# Patient Record
Sex: Male | Born: 1970 | Race: Black or African American | Hispanic: No | Marital: Single | Smoking: Former smoker
Health system: Southern US, Community
[De-identification: ages and names within clinical notes are randomized; demographics above are authoritative.]

## PROBLEM LIST (undated history)

## (undated) DIAGNOSIS — F419 Anxiety disorder, unspecified: Secondary | ICD-10-CM

## (undated) DIAGNOSIS — I10 Essential (primary) hypertension: Secondary | ICD-10-CM

---

## 2011-04-16 ENCOUNTER — Emergency Department (HOSPITAL_COMMUNITY)
Admission: EM | Admit: 2011-04-16 | Discharge: 2011-04-16 | Disposition: A | Discharge status: Home / Self Care | Payer: Self-pay | Attending: Emergency Medicine | Admitting: Emergency Medicine

## 2011-04-16 DIAGNOSIS — R040 Epistaxis: Secondary | ICD-10-CM | POA: Insufficient documentation

## 2011-04-16 DIAGNOSIS — Z79899 Other long term (current) drug therapy: Secondary | ICD-10-CM | POA: Insufficient documentation

## 2011-04-16 DIAGNOSIS — I10 Essential (primary) hypertension: Secondary | ICD-10-CM | POA: Insufficient documentation

## 2011-10-18 ENCOUNTER — Emergency Department (HOSPITAL_COMMUNITY)
Admission: EM | Admit: 2011-10-18 | Discharge: 2011-10-18 | Discharge status: Against Medical Advice | Payer: PRIVATE HEALTH INSURANCE | Attending: Emergency Medicine | Admitting: Emergency Medicine

## 2011-10-18 DIAGNOSIS — I10 Essential (primary) hypertension: Secondary | ICD-10-CM | POA: Insufficient documentation

## 2011-10-18 DIAGNOSIS — Z79899 Other long term (current) drug therapy: Secondary | ICD-10-CM | POA: Insufficient documentation

## 2011-10-18 LAB — CBC
MCH: 32.4 pg (ref 26.0–34.0)
MCHC: 35.3 g/dL (ref 30.0–36.0)
MCV: 91.6 fL (ref 78.0–100.0)
Platelets: 185 10*3/uL (ref 150–400)
RBC: 4.76 MIL/uL (ref 4.22–5.81)
RDW: 15.1 % (ref 11.5–15.5)

## 2011-10-18 LAB — DIFFERENTIAL
Basophils Relative: 0 % (ref 0–1)
Eosinophils Relative: 2 % (ref 0–5)
Lymphs Abs: 5.8 10*3/uL — ABNORMAL HIGH (ref 0.7–4.0)
Monocytes Absolute: 0.2 10*3/uL (ref 0.1–1.0)
Neutro Abs: 5.8 10*3/uL (ref 1.7–7.7)

## 2011-10-18 LAB — POCT I-STAT, CHEM 8
BUN: 15 mg/dL (ref 6–23)
Calcium, Ion: 1.16 mmol/L (ref 1.12–1.32)
TCO2: 26 mmol/L (ref 0–100)

## 2011-10-18 LAB — PATHOLOGIST SMEAR REVIEW

## 2011-11-10 ENCOUNTER — Emergency Department (HOSPITAL_COMMUNITY)
Admission: EM | Admit: 2011-11-10 | Discharge: 2011-11-10 | Disposition: A | Discharge status: Home / Self Care | Payer: PRIVATE HEALTH INSURANCE | Attending: Emergency Medicine | Admitting: Emergency Medicine

## 2011-11-10 DIAGNOSIS — Z760 Encounter for issue of repeat prescription: Secondary | ICD-10-CM | POA: Insufficient documentation

## 2011-11-10 DIAGNOSIS — Z79899 Other long term (current) drug therapy: Secondary | ICD-10-CM | POA: Insufficient documentation

## 2011-11-10 DIAGNOSIS — I10 Essential (primary) hypertension: Secondary | ICD-10-CM | POA: Insufficient documentation

## 2011-11-10 DIAGNOSIS — Z7982 Long term (current) use of aspirin: Secondary | ICD-10-CM | POA: Insufficient documentation

## 2011-11-10 HISTORY — DX: Essential (primary) hypertension: I10

## 2011-11-10 MED ORDER — CLONIDINE HCL 0.3 MG PO TABS: 0.3000 mg | ORAL_TABLET | Freq: Three times a day (TID) | ORAL | Status: DC

## 2011-11-10 MED ORDER — LISINOPRIL 20 MG PO TABS: 20.0000 mg | ORAL_TABLET | Freq: Every day | ORAL | Status: DC

## 2011-11-10 MED ORDER — HYDROCHLOROTHIAZIDE 25 MG PO TABS: 25.0000 mg | ORAL_TABLET | Freq: Every day | ORAL | Status: DC

## 2011-11-10 NOTE — ED Provider Notes (Signed)
History     CSN: [id number] Arrival date & time: 11/10/2011 10:27 AM   First MD Initiated Contact with Patient 11/10/11 1111      Chief Complaint  Patient presents with  . Hypertension    ran out of his meds yesterday, and need his meds refilled.   Had his meds filled a few weeks ago and he was suppose t o get Clonidine 0.3 mg TID but it was writtent for BID.     (Consider location/radiation/quality/duration/timing/severity/associated sxs/prior treatment) Patient is a 40 y.o. male presenting with hypertension. The history is provided by the patient.  Hypertension   is reports his been out of his blood pressure medicines for several days.  He reports he is on clonidine lisinopril and hydrochlorothiazide.  Denies chest pain shortness of breath.  Denies headache or dizziness.  Denies unilateral weakness.  Denies syncope.  Nothing worsens the symptoms.  Nothing improves his symptoms.  His pain is 0/10.  Past Medical History  Diagnosis Date  . Hypertension     History reviewed. No pertinent past surgical history.  History reviewed. No pertinent family history.  History  Substance Use Topics  . Smoking status: Not on file  . Smokeless tobacco: Not on file  . Alcohol Use: Yes      Review of Systems  All other systems reviewed and are negative.    Allergies  Review of patient's allergies indicates no known allergies.  Home Medications   Current Outpatient Rx  Name Route Sig Dispense Refill  . ASPIRIN-ACETAMINOPHEN-CAFFEINE 250-250-65 MG PO TABS Oral Take 2-3 tablets by mouth daily as needed. For pain.     Marland Kitchen CLONIDINE HCL 0.3 MG PO TABS Oral Take 0.3 mg by mouth 3 (three) times daily.      Marland Kitchen LISINOPRIL-HYDROCHLOROTHIAZIDE 20-25 MG PO TABS Oral Take 1 tablet by mouth daily.        BP 199/125  Pulse 80  Temp(Src) 98.3 F (36.8 C) (Oral)  Resp 20  Ht 5\' 7"  (1.702 m)  Wt 185 lb (83.915 kg)  BMI 28.97 kg/m2  SpO2 99%  Physical Exam  Nursing note and vitals  reviewed. Constitutional: He is oriented to person, place, and time. He appears well-developed and well-nourished.  HENT:  Head: Normocephalic and atraumatic.  Eyes: EOM are normal.  Neck: Normal range of motion.  Cardiovascular: Normal rate, regular rhythm, normal heart sounds and intact distal pulses.   Pulmonary/Chest: Effort normal and breath sounds normal. No respiratory distress.  Abdominal: Soft. He exhibits no distension. There is no tenderness.  Musculoskeletal: Normal range of motion.  Neurological: He is alert and oriented to person, place, and time.  Skin: Skin is warm and dry.  Psychiatric: He has a normal mood and affect. Judgment normal.    ED Course  Procedures (including critical care time)  Labs Reviewed - No data to display No results found.   1. Hypertension   2. Medication refill       MDM  BP meds written for.  Patient has insurance and will be returning to [facility] tomorrow followup with his doctor.  The patient was given a 30 day supply of history blood pressure medicines.  His blood pressure on arrival 199/125 over the patient is asymptomatic without any symptoms consistent with endorgan failure.        Lyanne Co, MD 11/10/11 1140

## 2011-11-10 NOTE — ED Notes (Signed)
Pt. Needs his BP meds  Refilled. Denies any headaches, dizziness , sob .

## 2012-02-03 ENCOUNTER — Emergency Department: Payer: Self-pay | Admitting: Emergency Medicine

## 2012-02-03 LAB — COMPREHENSIVE METABOLIC PANEL
Alkaline Phosphatase: 59 U/L (ref 50–136)
BUN: 23 mg/dL — ABNORMAL HIGH (ref 7–18)
Bilirubin,Total: 0.5 mg/dL (ref 0.2–1.0)
Calcium, Total: 9.3 mg/dL (ref 8.5–10.1)
Chloride: 103 mmol/L (ref 98–107)
Co2: 28 mmol/L (ref 21–32)
EGFR (African American): >60
EGFR (Non-African Amer.): >60
Glucose: 98 mg/dL (ref 65–99)
Potassium: 3.5 mmol/L (ref 3.5–5.1)
SGOT(AST): 27 U/L (ref 15–37)
SGPT (ALT): 46 U/L
Sodium: 139 mmol/L (ref 136–145)

## 2012-02-03 LAB — CBC
HGB: 15.6 g/dL (ref 13.0–18.0)
MCH: 31.8 pg (ref 26.0–34.0)
MCHC: 33.3 g/dL (ref 32.0–36.0)
RDW: 14.4 % (ref 11.5–14.5)

## 2012-03-07 ENCOUNTER — Emergency Department (HOSPITAL_COMMUNITY)
Admission: EM | Admit: 2012-03-07 | Discharge: 2012-03-07 | Disposition: A | Discharge status: Home / Self Care | Payer: Self-pay | Source: Home / Self Care | Attending: Emergency Medicine | Admitting: Emergency Medicine

## 2012-03-07 ENCOUNTER — Encounter (HOSPITAL_COMMUNITY): Payer: Self-pay

## 2012-03-07 DIAGNOSIS — I10 Essential (primary) hypertension: Secondary | ICD-10-CM

## 2012-03-07 MED ORDER — CLONIDINE HCL 0.3 MG PO TABS: 0.3000 mg | ORAL_TABLET | Freq: Two times a day (BID) | ORAL | Status: DC

## 2012-03-07 MED ORDER — ATENOLOL 50 MG PO TABS: 25.0000 mg | ORAL_TABLET | Freq: Every day | ORAL | Status: DC

## 2012-03-07 MED ORDER — TRIAMTERENE-HCTZ 37.5-25 MG PO TABS: 1.0000 | ORAL_TABLET | Freq: Every day | ORAL | Status: DC

## 2012-03-07 NOTE — ED Provider Notes (Signed)
History     CSN: [id number]  Arrival date & time 03/07/12  1115   First MD Initiated Contact with Patient 03/07/12 1118      Chief Complaint  Patient presents with  . Medication Refill    (Consider location/radiation/quality/duration/timing/severity/associated sxs/prior treatment) HPI Comments: Patient presents today to urgent care requesting blood pressure medicines refill describes no particular symptoms but describes that his blood pressure is always elevated in the 160s over 100s at home with a home monitor. Says that he feels is very dependent: Been taking 3 tablets per day and when he forgets and goes hours without surgical headache feels like his blood pressure is elevated so when he goes and checks there is usually higher.  Patient move recently and has no primary care doctor in the air describes that he's been on this medicines for 2-3 years and his blood pressure has always been high even with her previous provider.    The history is provided by the patient.    Past Medical History  Diagnosis Date  . Hypertension     History reviewed. No pertinent past surgical history.  History reviewed. No pertinent family history.  History  Substance Use Topics  . Smoking status: Not on file  . Smokeless tobacco: Not on file  . Alcohol Use: Yes      Review of Systems  Constitutional: Negative for fever and activity change.  Respiratory: Negative for chest tightness and shortness of breath.   Cardiovascular: Negative for chest pain, palpitations and leg swelling.  Neurological: Negative for dizziness, facial asymmetry, weakness and numbness.    Allergies  Review of patient's allergies indicates no known allergies.  Home Medications   Current Outpatient Rx  Name Route Sig Dispense Refill  . ASPIRIN-ACETAMINOPHEN-CAFFEINE 250-250-65 MG PO TABS Oral Take 2-3 tablets by mouth daily as needed. For pain.     . ATENOLOL 50 MG PO TABS Oral Take 0.5 tablets (25 mg total) by  mouth daily. 30 tablet 0  . CLONIDINE HCL 0.3 MG PO TABS Oral Take 0.3 mg by mouth 3 (three) times daily.      Marland Kitchen CLONIDINE HCL 0.3 MG PO TABS Oral Take 1 tablet (0.3 mg total) by mouth 2 (two) times daily. 90 tablet 0  . HYDROCHLOROTHIAZIDE 25 MG PO TABS Oral Take 1 tablet (25 mg total) by mouth daily. 30 tablet 0  . LISINOPRIL-HYDROCHLOROTHIAZIDE 20-25 MG PO TABS Oral Take 1 tablet by mouth daily.      . TRIAMTERENE-HCTZ 37.5-25 MG PO TABS Oral Take 1 each (1 tablet total) by mouth daily. 30 tablet 0    BP 170/110  Pulse 61  Temp(Src) 97.7 F (36.5 C) (Oral)  Resp 16  SpO2 97%  Physical Exam  Nursing note and vitals reviewed. Constitutional: He is oriented to person, place, and time. He appears well-developed and well-nourished. He appears lethargic. No distress.  HENT:  Head: Normocephalic.  Eyes: Conjunctivae are normal. No scleral icterus.  Neck: Neck supple. No JVD present.  Cardiovascular: Normal rate and regular rhythm.   Pulmonary/Chest: Effort normal. No respiratory distress. He has no decreased breath sounds. He has no wheezes. He has no rales. He exhibits no tenderness.  Lymphadenopathy:    He has no cervical adenopathy.  Neurological: He is oriented to person, place, and time. He has normal strength. He appears lethargic. He displays no tremor. No cranial nerve deficit or sensory deficit. He exhibits normal muscle tone.  Skin: Skin is warm.    ED Course  Procedures (including critical care time)  Labs Reviewed - No data to display No results found.   1. Hypertension       MDM   Chronic hypertension, recurrent with poor compliance and adherence to treatment asymptomatic during today's visit requesting medicine refill       Jimmie Molly, MD 03/07/12 1318

## 2012-03-07 NOTE — ED Notes (Signed)
Pt needs refills on clonidine and lisinopril.

## 2012-03-07 NOTE — Discharge Instructions (Signed)
We discuss her current high blood pressure regimen and the imperative need for you to followup with primary care Dr.  Layla Maw with her clonidine and start taking hydrochlorothiazide, after 6-7 days decrease her clonidine to twice a day. 2-had atenolol at the beginning of the second week and continue with your other 2 medicines. Monitor blood pressure as discussed in document readings,  if any further questions or changes before you see your new primary care doctor return for recheck.  If any changes such as chest pains, shortness of breath, swelling of your headaches, changes in vision associated with weakness or numbness or tingling of any extremity or facial go immediately to the emergency department as this could be signs of high blood pressure complications    Arterial Hypertension Arterial hypertension (high blood pressure) is a condition of elevated pressure in your blood vessels. Hypertension over a long period of time is a risk factor for strokes, heart attacks, and heart failure. It is also the leading cause of kidney (renal) failure.  CAUSES   In Adults -- Over 90% of all hypertension has no known cause. This is called essential or primary hypertension. In the other 10% of people with hypertension, the increase in blood pressure is caused by another disorder. This is called secondary hypertension. Important causes of secondary hypertension are:   Heavy alcohol use.   Obstructive sleep apnea.   Hyperaldosterosim (Conn's syndrome).   Steroid use.   Chronic kidney failure.   Hyperparathyroidism.   Medications.   Renal artery stenosis.   Pheochromocytoma.   Cushing's disease.   Coarctation of the aorta.   Scleroderma renal crisis.   Licorice (in excessive amounts).   Drugs (cocaine, methamphetamine).  Your caregiver can explain any items above that apply to you.  In Children -- Secondary hypertension is more common and should always be considered.   Pregnancy  -- Few women of childbearing age have high blood pressure. However, up to 10% of them develop hypertension of pregnancy. Generally, this will not harm the woman. It may be a sign of 3 complications of pregnancy: preeclampsia, HELLP syndrome, and eclampsia. Follow up and control with medication is necessary.  SYMPTOMS   This condition normally does not produce any noticeable symptoms. It is usually found during a routine exam.   Malignant hypertension is a late problem of high blood pressure. It may have the following symptoms:   Headaches.   Blurred vision.   End-organ damage (this means your kidneys, heart, lungs, and other organs are being damaged).   Stressful situations can increase the blood pressure. If a person with normal blood pressure has their blood pressure go up while being seen by their caregiver, this is often termed "white coat hypertension." Its importance is not known. It may be related with eventually developing hypertension or complications of hypertension.   Hypertension is often confused with mental tension, stress, and anxiety.  DIAGNOSIS  The diagnosis is made by 3 separate blood pressure measurements. They are taken at least 1 week apart from each other. If there is organ damage from hypertension, the diagnosis may be made without repeat measurements. Hypertension is usually identified by having blood pressure readings:  Above 140/90 mmHg measured in both arms, at 3 separate times, over a couple weeks.   Over 130/80 mmHg should be considered a risk factor and may require treatment in patients with diabetes.  Blood pressure readings over 120/80 mmHg are called "pre-hypertension" even in non-diabetic patients. To get a true blood pressure  measurement, use the following guidelines. Be aware of the factors that can alter blood pressure readings.  Take measurements at least 1 hour after caffeine.   Take measurements 30 minutes after smoking and without any stress. This  is another reason to quit smoking - it raises your blood pressure.   Use a proper cuff size. Ask your caregiver if you are not sure about your cuff size.   Most home blood pressure cuffs are automatic. They will measure systolic and diastolic pressures. The systolic pressure is the pressure reading at the start of sounds. Diastolic pressure is the pressure at which the sounds disappear. If you are elderly, measure pressures in multiple postures. Try sitting, lying or standing.   Sit at rest for a minimum of 5 minutes before taking measurements.   You should not be on any medications like decongestants. These are found in many cold medications.   Record your blood pressure readings and review them with your caregiver.  If you have hypertension:  Your caregiver may do tests to be sure you do not have secondary hypertension (see "causes" above).   Your caregiver may also look for signs of metabolic syndrome. This is also called Syndrome X or Insulin Resistance Syndrome. You may have this syndrome if you have type 2 diabetes, abdominal obesity, and abnormal blood lipids in addition to hypertension.   Your caregiver will take your medical and family history and perform a physical exam.   Diagnostic tests may include blood tests (for glucose, cholesterol, potassium, and kidney function), a urinalysis, or an EKG. Other tests may also be necessary depending on your condition.  PREVENTION  There are important lifestyle issues that you can adopt to reduce your chance of developing hypertension:  Maintain a normal weight.   Limit the amount of salt (sodium) in your diet.   Exercise often.   Limit alcohol intake.   Get enough potassium in your diet. Discuss specific advice with your caregiver.   Follow a DASH diet (dietary approaches to stop hypertension). This diet is rich in fruits, vegetables, and low-fat dairy products, and avoids certain fats.  PROGNOSIS  Essential hypertension cannot be  cured. Lifestyle changes and medical treatment can lower blood pressure and reduce complications. The prognosis of secondary hypertension depends on the underlying cause. Many people whose hypertension is controlled with medicine or lifestyle changes can live a normal, healthy life.  RISKS AND COMPLICATIONS  While high blood pressure alone is not an illness, it often requires treatment due to its short- and long-term effects on many organs. Hypertension increases your risk for:  CVAs or strokes (cerebrovascular accident).   Heart failure due to chronically high blood pressure (hypertensive cardiomyopathy).   Heart attack (myocardial infarction).   Damage to the retina (hypertensive retinopathy).   Kidney failure (hypertensive nephropathy).  Your caregiver can explain list items above that apply to you. Treatment of hypertension can significantly reduce the risk of complications. TREATMENT   For overweight patients, weight loss and regular exercise are recommended. Physical fitness lowers blood pressure.   Mild hypertension is usually treated with diet and exercise. A diet rich in fruits and vegetables, fat-free dairy products, and foods low in fat and salt (sodium) can help lower blood pressure. Decreasing salt intake decreases blood pressure in a 1/3 of people.   Stop smoking if you are a smoker.  The steps above are highly effective in reducing blood pressure. While these actions are easy to suggest, they are difficult to achieve. Most  patients with moderate or severe hypertension end up requiring medications to bring their blood pressure down to a normal level. There are several classes of medications for treatment. Blood pressure pills (antihypertensives) will lower blood pressure by their different actions. Lowering the blood pressure by 10 mmHg may decrease the risk of complications by as much as 25%. The goal of treatment is effective blood pressure control. This will reduce your risk  for complications. Your caregiver will help you determine the best treatment for you according to your lifestyle. What is excellent treatment for one person, may not be for you. HOME CARE INSTRUCTIONS   Do not smoke.   Follow the lifestyle changes outlined in the "Prevention" section.   If you are on medications, follow the directions carefully. Blood pressure medications must be taken as prescribed. Skipping doses reduces their benefit. It also puts you at risk for problems.   Follow up with your caregiver, as directed.   If you are asked to monitor your blood pressure at home, follow the guidelines in the "Diagnosis" section above.  SEEK MEDICAL CARE IF:   You think you are having medication side effects.   You have recurrent headaches or lightheadedness.   You have swelling in your ankles.   You have trouble with your vision.  SEEK IMMEDIATE MEDICAL CARE IF:   You have sudden onset of chest pain or pressure, difficulty breathing, or other symptoms of a heart attack.   You have a severe headache.   You have symptoms of a stroke (such as sudden weakness, difficulty speaking, difficulty walking).  MAKE SURE YOU:   Understand these instructions.   Will watch your condition.   Will get help right away if you are not doing well or get worse.  Document Released: 12/09/2005 Document Revised: 11/28/2011 Document Reviewed: 07/09/2007 [facility] Patient Information 2012 [place], [place].

## 2012-03-15 ENCOUNTER — Encounter (HOSPITAL_COMMUNITY): Payer: Self-pay | Admitting: Family Medicine

## 2012-03-15 ENCOUNTER — Other Ambulatory Visit: Payer: Self-pay

## 2012-03-15 ENCOUNTER — Inpatient Hospital Stay (HOSPITAL_COMMUNITY)
Admission: EM | Admit: 2012-03-15 | Discharge: 2012-03-17 | DRG: 305 | Disposition: A | Discharge status: Home / Self Care | Payer: Self-pay | Attending: Family Medicine | Admitting: Family Medicine

## 2012-03-15 ENCOUNTER — Inpatient Hospital Stay (HOSPITAL_COMMUNITY): Payer: Self-pay

## 2012-03-15 ENCOUNTER — Emergency Department (HOSPITAL_COMMUNITY): Payer: Self-pay

## 2012-03-15 DIAGNOSIS — I10 Essential (primary) hypertension: Secondary | ICD-10-CM

## 2012-03-15 DIAGNOSIS — R9431 Abnormal electrocardiogram [ECG] [EKG]: Secondary | ICD-10-CM

## 2012-03-15 DIAGNOSIS — R404 Transient alteration of awareness: Secondary | ICD-10-CM | POA: Diagnosis present

## 2012-03-15 DIAGNOSIS — I119 Hypertensive heart disease without heart failure: Secondary | ICD-10-CM | POA: Diagnosis present

## 2012-03-15 DIAGNOSIS — K5900 Constipation, unspecified: Secondary | ICD-10-CM | POA: Diagnosis present

## 2012-03-15 DIAGNOSIS — K5909 Other constipation: Secondary | ICD-10-CM | POA: Diagnosis present

## 2012-03-15 DIAGNOSIS — I498 Other specified cardiac arrhythmias: Secondary | ICD-10-CM | POA: Diagnosis present

## 2012-03-15 DIAGNOSIS — I1310 Hypertensive heart and chronic kidney disease without heart failure, with stage 1 through stage 4 chronic kidney disease, or unspecified chronic kidney disease: Principal | ICD-10-CM | POA: Diagnosis present

## 2012-03-15 DIAGNOSIS — F17200 Nicotine dependence, unspecified, uncomplicated: Secondary | ICD-10-CM | POA: Diagnosis present

## 2012-03-15 DIAGNOSIS — F411 Generalized anxiety disorder: Secondary | ICD-10-CM | POA: Diagnosis present

## 2012-03-15 DIAGNOSIS — E739 Lactose intolerance, unspecified: Secondary | ICD-10-CM | POA: Diagnosis present

## 2012-03-15 DIAGNOSIS — F1210 Cannabis abuse, uncomplicated: Secondary | ICD-10-CM | POA: Diagnosis present

## 2012-03-15 DIAGNOSIS — R55 Syncope and collapse: Secondary | ICD-10-CM

## 2012-03-15 DIAGNOSIS — N182 Chronic kidney disease, stage 2 (mild): Secondary | ICD-10-CM | POA: Diagnosis present

## 2012-03-15 LAB — DIFFERENTIAL
Basophils Absolute: 0 10*3/uL (ref 0.0–0.1)
Basophils Relative: 0 % (ref 0–1)
Eosinophils Absolute: 0.1 10*3/uL (ref 0.0–0.7)
Eosinophils Relative: 1 % (ref 0–5)
Lymphocytes Relative: 32 % (ref 12–46)
Lymphs Abs: 4.4 10*3/uL — ABNORMAL HIGH (ref 0.7–4.0)
Monocytes Absolute: 1.1 10*3/uL — ABNORMAL HIGH (ref 0.1–1.0)
Monocytes Relative: 8 % (ref 3–12)
Neutro Abs: 8.1 10*3/uL — ABNORMAL HIGH (ref 1.7–7.7)
Neutrophils Relative %: 59 % (ref 43–77)

## 2012-03-15 LAB — BASIC METABOLIC PANEL
BUN: 23 mg/dL (ref 6–23)
Calcium: 10.2 mg/dL (ref 8.4–10.5)
Creatinine, Ser: 1.47 mg/dL — ABNORMAL HIGH (ref 0.50–1.35)
GFR calc Af Amer: 68 mL/min — ABNORMAL LOW (ref >90)
GFR calc non Af Amer: 58 mL/min — ABNORMAL LOW (ref >90)
Potassium: 3.7 mEq/L (ref 3.5–5.1)

## 2012-03-15 LAB — URINALYSIS, ROUTINE W REFLEX MICROSCOPIC
Bilirubin Urine: NEGATIVE
Glucose, UA: NEGATIVE mg/dL
Hgb urine dipstick: NEGATIVE
Ketones, ur: NEGATIVE mg/dL
Leukocytes, UA: NEGATIVE
Nitrite: NEGATIVE
Protein, ur: NEGATIVE mg/dL
Specific Gravity, Urine: 1.021 (ref 1.005–1.030)
Urobilinogen, UA: 1 mg/dL (ref 0.0–1.0)
pH: 6 (ref 5.0–8.0)

## 2012-03-15 LAB — CARDIAC PANEL(CRET KIN+CKTOT+MB+TROPI)
Relative Index: 0.7 (ref 0.0–2.5)
Total CK: 499 U/L — ABNORMAL HIGH (ref 7–232)
Total CK: 515 U/L — ABNORMAL HIGH (ref 7–232)
Troponin I: <0.3 ng/mL (ref <0.30)
Troponin I: <0.3 ng/mL (ref <0.30)

## 2012-03-15 LAB — CBC
HCT: 47.2 % (ref 39.0–52.0)
MCHC: 35.2 g/dL (ref 30.0–36.0)
RDW: 15 % (ref 11.5–15.5)

## 2012-03-15 LAB — RAPID URINE DRUG SCREEN, HOSP PERFORMED
Barbiturates: NOT DETECTED
Tetrahydrocannabinol: POSITIVE — AB

## 2012-03-15 LAB — PRO B NATRIURETIC PEPTIDE: Pro B Natriuretic peptide (BNP): 59.5 pg/mL (ref 0–125)

## 2012-03-15 LAB — TROPONIN I: Troponin I: <0.3 ng/mL (ref <0.30)

## 2012-03-15 MED ORDER — ACETAMINOPHEN 650 MG RE SUPP: 650.0000 mg | Freq: Four times a day (QID) | RECTAL | Status: DC | PRN

## 2012-03-15 MED ORDER — ACETAMINOPHEN 325 MG PO TABS
650.0000 mg | ORAL_TABLET | Freq: Four times a day (QID) | ORAL | Status: DC | PRN
  Administered 2012-03-16 (×2): 650 mg via ORAL
  Filled 2012-03-15 (×2): qty 2

## 2012-03-15 MED ORDER — LISINOPRIL 10 MG PO TABS
10.0000 mg | ORAL_TABLET | Freq: Every day | ORAL | Status: DC
  Administered 2012-03-15: 10 mg via ORAL
  Filled 2012-03-15: qty 1

## 2012-03-15 MED ORDER — CLONIDINE HCL 0.3 MG PO TABS
0.3000 mg | ORAL_TABLET | Freq: Two times a day (BID) | ORAL | Status: DC
  Administered 2012-03-15: 0.3 mg via ORAL
  Filled 2012-03-15 (×4): qty 1

## 2012-03-15 MED ORDER — ASPIRIN EC 81 MG PO TBEC
81.0000 mg | DELAYED_RELEASE_TABLET | Freq: Every day | ORAL | Status: DC
  Administered 2012-03-15 – 2012-03-17 (×3): 81 mg via ORAL
  Filled 2012-03-15 (×4): qty 1

## 2012-03-15 MED ORDER — HYDRALAZINE HCL 25 MG PO TABS
25.0000 mg | ORAL_TABLET | Freq: Four times a day (QID) | ORAL | Status: DC
  Administered 2012-03-15 – 2012-03-17 (×8): 25 mg via ORAL
  Filled 2012-03-15 (×14): qty 1

## 2012-03-15 MED ORDER — SODIUM CHLORIDE 0.9 % IV SOLN
INTRAVENOUS | Status: AC
  Administered 2012-03-15: 100 mL/h via INTRAVENOUS

## 2012-03-15 MED ORDER — TRAZODONE 25 MG HALF TABLET
25.0000 mg | ORAL_TABLET | Freq: Every evening | ORAL | Status: DC | PRN
  Administered 2012-03-15: 25 mg via ORAL
  Filled 2012-03-15: qty 1

## 2012-03-15 MED ORDER — NITROGLYCERIN 0.3 MG SL SUBL
0.3000 mg | SUBLINGUAL_TABLET | SUBLINGUAL | Status: DC | PRN
  Filled 2012-03-15: qty 100

## 2012-03-15 MED ORDER — LISINOPRIL 20 MG PO TABS
20.0000 mg | ORAL_TABLET | Freq: Every day | ORAL | Status: DC
  Administered 2012-03-16 – 2012-03-17 (×2): 20 mg via ORAL
  Filled 2012-03-15 (×3): qty 1

## 2012-03-15 MED ORDER — NITROGLYCERIN 0.4 MG/SPRAY TL SOLN
1.0000 | Status: DC | PRN
  Filled 2012-03-15: qty 4.9

## 2012-03-15 MED ORDER — HYDROCHLOROTHIAZIDE 12.5 MG PO CAPS
12.5000 mg | ORAL_CAPSULE | Freq: Every day | ORAL | Status: DC
  Administered 2012-03-15: 12.5 mg via ORAL
  Filled 2012-03-15 (×2): qty 1

## 2012-03-15 MED ORDER — SODIUM CHLORIDE 0.9 % IV BOLUS (SEPSIS)
1000.0000 mL | Freq: Once | INTRAVENOUS | Status: AC
  Administered 2012-03-15: 1000 mL via INTRAVENOUS

## 2012-03-15 MED ORDER — ONDANSETRON HCL 4 MG/2ML IJ SOLN: 4.0000 mg | Freq: Three times a day (TID) | INTRAMUSCULAR | Status: AC | PRN

## 2012-03-15 MED ORDER — AMLODIPINE BESYLATE 10 MG PO TABS
10.0000 mg | ORAL_TABLET | Freq: Every day | ORAL | Status: DC
  Administered 2012-03-15 – 2012-03-17 (×3): 10 mg via ORAL
  Filled 2012-03-15 (×4): qty 1

## 2012-03-15 MED ORDER — DOCUSATE SODIUM 100 MG PO CAPS
100.0000 mg | ORAL_CAPSULE | Freq: Two times a day (BID) | ORAL | Status: DC
  Administered 2012-03-15 – 2012-03-17 (×3): 100 mg via ORAL
  Filled 2012-03-15 (×8): qty 1

## 2012-03-15 MED ORDER — ENOXAPARIN SODIUM 40 MG/0.4ML ~~LOC~~ SOLN
40.0000 mg | SUBCUTANEOUS | Status: DC
  Administered 2012-03-15 – 2012-03-16 (×2): 40 mg via SUBCUTANEOUS
  Filled 2012-03-15 (×4): qty 0.4

## 2012-03-15 MED ORDER — LISINOPRIL 10 MG PO TABS
10.0000 mg | ORAL_TABLET | Freq: Once | ORAL | Status: AC
  Administered 2012-03-15: 10 mg via ORAL
  Filled 2012-03-15: qty 1

## 2012-03-15 MED ORDER — HYDRALAZINE HCL 10 MG PO TABS
10.0000 mg | ORAL_TABLET | Freq: Four times a day (QID) | ORAL | Status: DC
  Administered 2012-03-15: 10 mg via ORAL
  Filled 2012-03-15 (×7): qty 1

## 2012-03-15 MED ORDER — INFLUENZA VIRUS VACC SPLIT PF IM SUSP
0.5000 mL | INTRAMUSCULAR | Status: AC
  Filled 2012-03-15: qty 0.5

## 2012-03-15 MED ORDER — SODIUM CHLORIDE 0.9 % IJ SOLN
3.0000 mL | Freq: Two times a day (BID) | INTRAMUSCULAR | Status: DC
Start: 2012-03-15 — End: 2012-03-17
  Administered 2012-03-15 – 2012-03-17 (×4): 3 mL via INTRAVENOUS

## 2012-03-15 NOTE — Progress Notes (Signed)
HS BP=187/111 HR=66--At 22:59pm texted DR Mikeal Hawthorne, received a phone call from Dr Mikeal Hawthorne @ 23:04pm orders received to go ahead & giv the Apresoline now as opposed to waiting til 12mn when due as scheduled med. The pt denies any pain ;sob; or c/o headache at this time. Also will give the prn for sleep also to see if this helps with BP problem to relax pt. Will con't to monitor for any problems.

## 2012-03-15 NOTE — ED Notes (Signed)
[id number]<AV> Expected date:03/15/12<BR> Expected time: 2:46 AM<BR> Means of arrival:Ambulance<BR> Comments:<BR> Syncopal episode

## 2012-03-15 NOTE — ED Notes (Signed)
Bed:WA01<BR> Expected date:<BR> Expected time:<BR> Means of arrival:<BR> Comments:<BR> closed

## 2012-03-15 NOTE — Progress Notes (Signed)
VASCULAR LAB PRELIMINARY  PRELIMINARY  PRELIMINARY  PRELIMINARY  Carotid Dopplers completed.    Preliminary report: No ICA stenosis.  Vertebral artery flow is antegrade.  Cory Rivera [place], 03/15/2012, 10:56 AM

## 2012-03-15 NOTE — Progress Notes (Signed)
BP 186/123 HR 62 patient resting comfortably in bed, no c/o pain or discomfort. Dr Laural Benes updated, new orders received. Will continue to monitor closely. Marisa Sprinkles RN

## 2012-03-15 NOTE — Progress Notes (Signed)
BP now 210/150 HR 68. 10 mg lisinopril and 10mg  hydralazine given at 1505. Dr Laural Benes updated and continuing to monitor closely. Marisa Sprinkles RN

## 2012-03-15 NOTE — ED Notes (Signed)
Per EMS: pt from home, per girlfriend pt had a syncopal episode, was sitting in the chair and states was "falling a sleep", pt girlfriend aroused him and "spring back to life", reports feeling hot, thereafter experienced weakness. Pt on the x4 BP medications.

## 2012-03-15 NOTE — ED Notes (Signed)
Pt. Alert and oriented x 3 . He states he took medications: Lisinopril 20mg , Clonidine0.3mg , Atenolol 50mg , and Triam/HCTZ 37.5/25mg  as scheduled tonight. He admits to taking an extra dose of clonidine b/c he thought his BP was elevated since he was having had an episode. Mew medication was received today: Atenolol 50mg . Pt. Does not recall events that happened after the syncope.

## 2012-03-15 NOTE — ED Notes (Signed)
Bed:WA21<BR> Expected date:<BR> Expected time:<BR> Means of arrival:<BR> Comments:<BR> hold

## 2012-03-15 NOTE — H&P (Signed)
History and Physical Examination  Date: 03/15/2012  Patient name: Cory Rivera Medical record number: [id number] Date of birth: [birth year] Age: 41 y.o. Gender: male PCP: No primary provider on file.  Attending physician: Glynn Octave, MD  Chief Complaint:  Chief Complaint  Patient presents with  . Loss of Consciousness  . Hypertension     History of Present Illness: Cory Rivera is an 41 y.o. male with a long history of poorly controlled hypertension presented to the emergency department after having a witnessed syncopal episode at home.  He was apparently sitting in a chair when he reported that he was feeling lightheaded and his eyes rolled in the back of his head and he passed out and fell on the floor.  He had recently been started on 4 different blood pressure medications.  He had not been seeing a primary care physician because he doesn't have medical insurance.  He reports that he's been feeling weaker since he started taking the blood pressure medications.  He reports that he uses marijuana on a daily basis.  The patient reports that he's having chronic constipation and abdominal pain on occasion.  He is lactose intolerant.  He denies chest pain or shortness of breath.  When he was seen in the emergency department he was found to have some EKG changes consistent with cardiomegaly and some flipped T waves were noted.  His first set of cardiac enzymes have been negative.  A hospital admission was requested for further evaluation and management.  Past Medical History Past Medical History  Diagnosis Date  . Hypertension     Past Surgical History No past surgical history on file.  Home Meds: Prior to Admission medications   Medication Sig Start Date End Date Taking? Authorizing Provider  aspirin-acetaminophen-caffeine (EXCEDRIN MIGRAINE) [phone] MG per tablet Take 2-3 tablets by mouth daily as needed. For pain.    Yes Historical Provider, MD  atenolol (TENORMIN) 50 MG  tablet Take 0.5 tablets (25 mg total) by mouth daily. 03/07/12 03/07/13 Yes Jimmie Molly, MD  cloNIDine (CATAPRES) 0.3 MG tablet Take 1 tablet (0.3 mg total) by mouth 2 (two) times daily. 03/07/12 03/07/13 Yes Jimmie Molly, MD  lisinopril (PRINIVIL,ZESTRIL) 20 MG tablet Take 20 mg by mouth daily.   Yes Historical Provider, MD  triamterene-hydrochlorothiazide (MAXZIDE-25) 37.5-25 MG per tablet Take 1 each (1 tablet total) by mouth daily. 03/07/12 04/04/13 Yes Jimmie Molly, MD    Allergies: Review of patient's allergies indicates no known allergies.  Social History:  History   Social History  . Marital Status: Single    Spouse Name: N/A    Number of Children: N/A  . Years of Education: N/A   Occupational History  . Not on file.   Social History Main Topics  . Smoking status: Not on file  . Smokeless tobacco: Not on file  . Alcohol Use: Yes  . Drug Use:   . Sexually Active:    Other Topics Concern  . Not on file   Social History Narrative  . No narrative on file   Family History: No family history on file.  Review of Systems: Pertinent items are noted in HPI. All other systems reviewed and reported as negative.   Physical Exam: Blood pressure 148/68, pulse 66, temperature 97.9 F (36.6 C), temperature source Oral, resp. rate 21, height 5\' 7"  (1.702 m), weight 86.183 kg (190 lb), SpO2 100.00%. BP 148/68  Pulse 66  Temp(Src) 97.9 F (36.6 C) (Oral)  Resp 21  Ht 5\' 7"  (  1.702 m)  Wt 86.183 kg (190 lb)  BMI 29.76 kg/m2  SpO2 100%  General Appearance:    Alert, cooperative, no distress, appears stated age  Head:    Normocephalic, without obvious abnormality, atraumatic  Eyes:    PERRL, conjunctiva/corneas clear, EOM's intact          Nose:   Nares normal, septum midline, mucosa normal, no drainage    or sinus tenderness  Throat:   Lips, mucosa, and tongue normal; teeth and gums normal  Neck:   Supple, symmetrical, trachea midline, no adenopathy;       thyroid:  No  enlargement/tenderness/nodules; no carotid   bruit or JVD     Lungs:     Clear to auscultation bilaterally, respirations unlabored  Chest wall:    No tenderness or deformity  Heart:    Regular rate and rhythm, S1 and S2 normal, no murmur, rub   or gallop  Abdomen:     Soft, non-tender, bowel sounds active all four quadrants,    no masses, no organomegaly        Extremities:   Extremities normal, atraumatic, no cyanosis or edema  Pulses:   2+ and symmetric all extremities  Skin:   Skin color, texture, turgor normal, no rashes or lesions  Lymph nodes:   Cervical, supraclavicular, and axillary nodes normal  Neurologic:   CNII-XII intact. Normal strength, sensation and reflexes      throughout    Lab  And Imaging results:  Results for orders placed during the hospital encounter of 03/15/12 (from the past 24 hour(s))  CBC     Status: Abnormal   Collection Time   03/15/12  3:34 AM      Component Value Range   WBC 13.7 (*) 4.0 - 10.5 (K/uL)   RBC 5.06  4.22 - 5.81 (MIL/uL)   Hemoglobin 16.6  13.0 - 17.0 (g/dL)   HCT 16.1  09.6 - 04.5 (%)   MCV 93.3  78.0 - 100.0 (fL)   MCH 32.8  26.0 - 34.0 (pg)   MCHC 35.2  30.0 - 36.0 (g/dL)   RDW 40.9  81.1 - 91.4 (%)   Platelets 190  150 - 400 (K/uL)  DIFFERENTIAL     Status: Abnormal   Collection Time   03/15/12  3:34 AM      Component Value Range   Neutrophils Relative 59  43 - 77 (%)   Lymphocytes Relative 32  12 - 46 (%)   Monocytes Relative 8  3 - 12 (%)   Eosinophils Relative 1  0 - 5 (%)   Basophils Relative 0  0 - 1 (%)   Neutro Abs 8.1 (*) 1.7 - 7.7 (K/uL)   Lymphs Abs 4.4 (*) 0.7 - 4.0 (K/uL)   Monocytes Absolute 1.1 (*) 0.1 - 1.0 (K/uL)   Eosinophils Absolute 0.1  0.0 - 0.7 (K/uL)   Basophils Absolute 0.0  0.0 - 0.1 (K/uL)   WBC Morphology TOXIC GRANULATION    BASIC METABOLIC PANEL     Status: Abnormal   Collection Time   03/15/12  3:34 AM      Component Value Range   Sodium 137  135 - 145 (mEq/L)   Potassium 3.7  3.5 - 5.1  (mEq/L)   Chloride 99  96 - 112 (mEq/L)   CO2 29  19 - 32 (mEq/L)   Glucose, Bld 109 (*) 70 - 99 (mg/dL)   BUN 23  6 - 23 (mg/dL)   Creatinine,  Ser 1.47 (*) 0.50 - 1.35 (mg/dL)   Calcium 16.1  8.4 - 10.5 (mg/dL)   GFR calc non Af Amer 58 (*) >90 (mL/min)   GFR calc Af Amer 68 (*) >90 (mL/min)  TROPONIN I     Status: Normal   Collection Time   03/15/12  3:34 AM      Component Value Range   Troponin I <0.30  <0.30 (ng/mL)  D-DIMER, QUANTITATIVE     Status: Normal   Collection Time   03/15/12  3:34 AM      Component Value Range   D-Dimer, Quant 0.31  0.00 - 0.48 (ug/mL-FEU)   EKG Results:  Orders placed during the hospital encounter of 03/15/12  . ED EKG  . ED EKG     Impression  Active Problems:  Hypertension  Cardiomegaly - hypertensive  Chronic constipation  Lactose intolerance  Smoker  Marijuana User Sinus bradycardia Renal insufficiency  Plan  Admit the patient to telemetry bed.  I believe that he became bradycardic and hypotensive and this led to his acute syncopal episode.  He recently started the blood pressure medications and had taken all 4 medications yesterday.  He is currently still bradycardic.  He is mildly dehydrated and is receiving a fluid bolus.  Monitor his creatinine.  Cycle cardiac enzymes, check echocardiogram.  Check fasting lipid panel.  Holding beta blocker at this time.  Please see orders and follow hospital course.   Standley Dakins MD [facility] [place], [place] [phone] 03/15/2012, 7:41 AM

## 2012-03-15 NOTE — ED Notes (Signed)
Pt informed multiple times on need for urine specimen. Pt unable to provide at this time. Urinal at pt bedside. Will continue to monitor.

## 2012-03-15 NOTE — ED Provider Notes (Signed)
History     CSN: [id number]  Arrival date & time 03/15/12  0247   First MD Initiated Contact with Patient 03/15/12 [phone]      Chief Complaint  Patient presents with  . Loss of Consciousness  . Hypertension    (Consider location/radiation/quality/duration/timing/severity/associated sxs/prior treatment) HPI Comments: Patient presents from home via EMS after a syncopal episode. Patient's girlfriend states he was sitting in a chair complaining that he was lightheaded, his eyes rolled back in his head he became unresponsive for a few seconds. He then tried to get up and fell. He is remembers feeling lightheaded and dizzy states the room was spinning and use as he stood up. He felt short of breath but denies any chest pain. He still feels generally weak.  He denies any nausea or vomiting. He has a history of hypertension is on multiple medications but is not her primary care Dr.  The history is provided by the patient and a relative.    Past Medical History  Diagnosis Date  . Hypertension     No past surgical history on file.  No family history on file.  History  Substance Use Topics  . Smoking status: Not on file  . Smokeless tobacco: Not on file  . Alcohol Use: Yes      Review of Systems  Constitutional: Negative for fever, activity change and appetite change.  HENT: Negative for congestion and rhinorrhea.   Eyes: Negative for visual disturbance.  Respiratory: Positive for shortness of breath. Negative for chest tightness.   Cardiovascular: Positive for syncope. Negative for chest pain.  Gastrointestinal: Negative for nausea, vomiting and abdominal pain.  Genitourinary: Negative for dysuria, scrotal swelling and testicular pain.  Musculoskeletal: Negative for back pain.  Skin: Negative for rash.  Neurological: Positive for dizziness, syncope and light-headedness.    Allergies  Review of patient's allergies indicates no known allergies.  Home Medications   Current  Outpatient Rx  Name Route Sig Dispense Refill  . ASPIRIN-ACETAMINOPHEN-CAFFEINE 250-250-65 MG PO TABS Oral Take 2-3 tablets by mouth daily as needed. For pain.     . ATENOLOL 50 MG PO TABS Oral Take 0.5 tablets (25 mg total) by mouth daily. 30 tablet 0  . CLONIDINE HCL 0.3 MG PO TABS Oral Take 1 tablet (0.3 mg total) by mouth 2 (two) times daily. 90 tablet 0  . LISINOPRIL 20 MG PO TABS Oral Take 20 mg by mouth daily.    . TRIAMTERENE-HCTZ 37.5-25 MG PO TABS Oral Take 1 each (1 tablet total) by mouth daily. 30 tablet 0    BP 147/98  Pulse 72  Resp 20  Ht 5\' 7"  (1.702 m)  Wt 190 lb (86.183 kg)  BMI 29.76 kg/m2  SpO2 99%  Physical Exam  Constitutional: He is oriented to person, place, and time. He appears well-developed and well-nourished. No distress.  HENT:  Head: Normocephalic and atraumatic.  Mouth/Throat: Oropharynx is clear and moist. No oropharyngeal exudate.  Eyes: Conjunctivae and EOM are normal. Pupils are equal, round, and reactive to light.  Neck: Normal range of motion. Neck supple.  Cardiovascular: Normal rate, regular rhythm and normal heart sounds.   Pulmonary/Chest: Effort normal and breath sounds normal. No respiratory distress.  Abdominal: Soft. There is no tenderness. There is no rebound and no guarding.  Musculoskeletal: Normal range of motion. He exhibits no edema and no tenderness.  Neurological: He is alert and oriented to person, place, and time. No cranial nerve deficit.  Skin: Skin is warm.  ED Course  Procedures (including critical care time)  Labs Reviewed  CBC - Abnormal; Notable for the following:    WBC 13.7 (*)    All other components within normal limits  DIFFERENTIAL - Abnormal; Notable for the following:    Neutro Abs 8.1 (*)    Lymphs Abs 4.4 (*)    Monocytes Absolute 1.1 (*)    All other components within normal limits  BASIC METABOLIC PANEL - Abnormal; Notable for the following:    Glucose, Bld 109 (*)    Creatinine, Ser 1.47 (*)     GFR calc non Af Amer 58 (*)    GFR calc Af Amer 68 (*)    All other components within normal limits  TROPONIN I  D-DIMER, QUANTITATIVE  URINALYSIS, ROUTINE W REFLEX MICROSCOPIC  URINE RAPID DRUG SCREEN (HOSP PERFORMED)   Dg Chest 2 View  03/15/2012  *RADIOLOGY REPORT*  Clinical Data: Syncope, hypertension  CHEST - 2 VIEW  Comparison: None  Findings: Borderline enlargement of cardiac silhouette. Mediastinal contours and pulmonary vascularity normal. Lungs clear. No pleural effusion or pneumothorax. Bones unremarkable.  IMPRESSION: No acute abnormalities.  Original Report Authenticated By: Lollie Marrow, M.D.   Ct Head Wo Contrast  03/15/2012  *RADIOLOGY REPORT*  Clinical Data: Syncope, dizziness, lightheaded, loss of consciousness, hypertension  CT HEAD WITHOUT CONTRAST  Technique:  Contiguous axial images were obtained from the base of the skull through the vertex without contrast.  Comparison: None  Findings: Minimal atrophy for age. Normal ventral morphology. No midline shift or mass effect. No intracranial hemorrhage, mass lesion or evidence of acute infarction. No extra-axial fluid collections. Visualized paranasal sinuses and mastoid air cells clear. Bones unremarkable.  IMPRESSION: No acute intracranial abnormalities.  Original Report Authenticated By: Lollie Marrow, M.D.     1. Syncope   2. EKG abnormalities       MDM  Syncopal episode with shortness of breath and lightheadedness. Vital signs stable, sinus bradycardia. polypharmacy possibly contributing.  Probable vasovagal syncope after getting up from chair.  No chest pain, some SOB.  Still feels generally weak. EKG changes possibly consistent with LVH.     Date: 03/15/2012  Rate: 55  Rhythm: normal sinus rhythm  QRS Axis: normal  Intervals: normal  ST/T Wave abnormalities: nonspecific ST/T changes  Conduction Disutrbances:none  Narrative Interpretation: T wave inversions in lateral and inferior leads that are new  Old  EKG Reviewed: changes noted          Glynn Octave, MD 03/15/12 [phone]

## 2012-03-16 ENCOUNTER — Other Ambulatory Visit: Payer: Self-pay

## 2012-03-16 LAB — CBC
MCH: 31.6 pg (ref 26.0–34.0)
MCHC: 34.2 g/dL (ref 30.0–36.0)
MCV: 92.5 fL (ref 78.0–100.0)
Platelets: 197 10*3/uL (ref 150–400)
RDW: 14.9 % (ref 11.5–15.5)

## 2012-03-16 LAB — COMPREHENSIVE METABOLIC PANEL
AST: 28 U/L (ref 0–37)
Albumin: 3.8 g/dL (ref 3.5–5.2)
Alkaline Phosphatase: 50 U/L (ref 39–117)
Chloride: 103 mEq/L (ref 96–112)
Potassium: 3.3 mEq/L — ABNORMAL LOW (ref 3.5–5.1)
Total Bilirubin: 1.2 mg/dL (ref 0.3–1.2)

## 2012-03-16 LAB — CARDIAC PANEL(CRET KIN+CKTOT+MB+TROPI)
Relative Index: 0.6 (ref 0.0–2.5)
Troponin I: <0.3 ng/mL (ref <0.30)

## 2012-03-16 MED ORDER — LABETALOL HCL 5 MG/ML IV SOLN
10.0000 mg | INTRAVENOUS | Status: DC | PRN
  Administered 2012-03-16: 10 mg via INTRAVENOUS
  Filled 2012-03-16: qty 4

## 2012-03-16 MED ORDER — CLONIDINE HCL 0.3 MG PO TABS
0.3000 mg | ORAL_TABLET | Freq: Three times a day (TID) | ORAL | Status: DC
  Administered 2012-03-16 – 2012-03-17 (×5): 0.3 mg via ORAL
  Filled 2012-03-16 (×9): qty 1

## 2012-03-16 MED ORDER — SPIRONOLACTONE 25 MG PO TABS
25.0000 mg | ORAL_TABLET | Freq: Every day | ORAL | Status: DC
  Administered 2012-03-16 – 2012-03-17 (×2): 25 mg via ORAL
  Filled 2012-03-16 (×3): qty 1

## 2012-03-16 MED ORDER — CARVEDILOL 6.25 MG PO TABS
6.2500 mg | ORAL_TABLET | Freq: Two times a day (BID) | ORAL | Status: DC
  Administered 2012-03-17 (×2): 6.25 mg via ORAL
  Filled 2012-03-16 (×4): qty 1

## 2012-03-16 MED ORDER — ALPRAZOLAM ER 0.5 MG PO TB24
0.5000 mg | ORAL_TABLET | Freq: Three times a day (TID) | ORAL | Status: DC | PRN
  Administered 2012-03-16 – 2012-03-17 (×3): 0.5 mg via ORAL
  Filled 2012-03-16 (×3): qty 1

## 2012-03-16 MED ORDER — HYDRALAZINE HCL 20 MG/ML IJ SOLN
10.0000 mg | Freq: Four times a day (QID) | INTRAMUSCULAR | Status: DC | PRN
  Administered 2012-03-16 (×2): 10 mg via INTRAVENOUS
  Filled 2012-03-16 (×2): qty 1

## 2012-03-16 MED ORDER — POTASSIUM CHLORIDE CRYS ER 20 MEQ PO TBCR
40.0000 meq | EXTENDED_RELEASE_TABLET | Freq: Once | ORAL | Status: AC
  Administered 2012-03-16: 40 meq via ORAL
  Filled 2012-03-16: qty 2

## 2012-03-16 MED ORDER — INFLUENZA VIRUS VACC SPLIT PF IM SUSP
0.5000 mL | INTRAMUSCULAR | Status: AC
  Administered 2012-03-17: 0.5 mL via INTRAMUSCULAR
  Filled 2012-03-16: qty 0.5

## 2012-03-16 NOTE — Progress Notes (Signed)
*  PRELIMINARY RESULTS* Echocardiogram 2D Echocardiogram has been performed.  Cory Rivera [facility] 03/16/2012, 1:10 PM

## 2012-03-16 NOTE — Progress Notes (Signed)
   CARE MANAGEMENT NOTE 03/16/2012  Patient:  Cory Rivera, Cory Rivera   Account Number:  [id number]  Date Initiated:  03/16/2012  Documentation initiated by:  Lanier Clam  Subjective/Objective Assessment:   ADMITTED W/LOSS OF CONSCIOUNESS,HTN.     Action/Plan:   FROM HOME.WORKS.[employer],SUPPORT,TRANSP.   Anticipated DC Date:  03/19/2012   Anticipated DC Plan:  HOME/SELF CARE      DC Planning Services  [facility]      Choice offered to / List presented to:             Status of service:  In process, will continue to follow Medicare Important Message given?   (If response is "NO", the following Medicare IM given date fields will be blank) Date Medicare IM given:   Date Additional Medicare IM given:    Discharge Disposition:    Per UR Regulation:  Reviewed for med. necessity/level of care/duration of stay  If discussed at Long Length of Stay Meetings, dates discussed:    Comments:  03/16/12 [facility] RN,BSN NCM [phone] PROVIDED W/[facility] TEL# FOR PCP.

## 2012-03-16 NOTE — Progress Notes (Signed)
PATIENT DETAILS Name: Cory Rivera Age: 41 y.o. Sex: male Date of Birth: January 12, 1971 Admit Date: 03/15/2012 PCP:No primary provider on file. POA:   CONSULTS: None  Subjective: Was out of BP meds 1 week prior to having them refilled on 3/16 at [facility]. He was rx'd same medication regimen as prior to visit. Pt denies any chest pain or sob. No recent fever or cough. Feeling anxious b/c he wants to smoke marijuana  Objective: Vital signs in last 24 hours: Temp:  [97.5 F (36.4 C)-98.8 F (37.1 C)] 98.8 F (37.1 C) (03/25 0634) Pulse Rate:  [59-70] 64  (03/25 0634) Resp:  [18-20] 20  (03/25 0634) BP: (157-210)/(104-150) 188/133 mmHg (03/25 0634) SpO2:  [97 %-100 %] 97 % (03/25 0634) Weight:  [91.4 kg (201 lb 8 oz)] 91.4 kg (201 lb 8 oz) (03/24 0805) Weight change: 5.217 kg (11 lb 8 oz) Last BM Date: 03/15/12  Intake/Output from previous day:  Intake/Output Summary (Last 24 hours) at 03/16/12 0758 Last data filed at 03/16/12 1610  Gross per 24 hour  Intake   1923 ml  Output    450 ml  Net   1473 ml     Physical Exam:  Gen:  Awake, alert sitting up in bed in NAD HEENT: no JVD or carotid bruits Cardiovascular:  S1S2 RRR, no m/r/g Respiratory: CTAB, no w/r/c, no increased wob Gastrointestinal: abdomen soft, NT/ND, BS+ Extremities: no c/c/e   Lab Results:  Lab 03/16/12 0420 03/15/12 0334  HGB 15.6 16.6  HCT 45.6 47.2  WBC 12.3* 13.7*  PLT 197 190     Lab 03/16/12 0420 03/15/12 0334  NA 139 137  K 3.3* 3.7  CL 103 99  CO2 29 29  GLUCOSE 96 109*  BUN 17 23  CREATININE 1.27 1.47*  CALCIUM 9.2 10.2  MG -- --  PHOS -- --    Studies/Results: Dg Chest 2 View  03/15/2012  *RADIOLOGY REPORT*  Clinical Data: Syncope, hypertension  CHEST - 2 VIEW  Comparison: None  Findings: Borderline enlargement of cardiac silhouette. Mediastinal contours and pulmonary vascularity normal. Lungs clear. No pleural effusion or pneumothorax. Bones unremarkable.  IMPRESSION: No acute  abnormalities.  Original Report Authenticated By: Lollie Marrow, M.D.   Abd 1 View (kub)  03/15/2012  *RADIOLOGY REPORT*  Clinical Data: Chronic constipation.  Abdominal pain.  ABDOMEN - 1 VIEW 03/15/2012:  Comparison: None.  Findings: Bowel gas pattern unremarkable without evidence of obstruction or significant ileus.  No suggestion of free air on the supine image.  No visible opaque urinary tract calculi.  Phlebolith in the left side of the pelvis.  Regional skeleton unremarkable.  IMPRESSION: No acute abdominal abnormality.  Original Report Authenticated By: Arnell Sieving, M.D.   Ct Head Wo Contrast  03/15/2012  *RADIOLOGY REPORT*  Clinical Data: Syncope, dizziness, lightheaded, loss of consciousness, hypertension  CT HEAD WITHOUT CONTRAST  Technique:  Contiguous axial images were obtained from the base of the skull through the vertex without contrast.  Comparison: None  Findings: Minimal atrophy for age. Normal ventral morphology. No midline shift or mass effect. No intracranial hemorrhage, mass lesion or evidence of acute infarction. No extra-axial fluid collections. Visualized paranasal sinuses and mastoid air cells clear. Bones unremarkable.  IMPRESSION: No acute intracranial abnormalities.  Original Report Authenticated By: Lollie Marrow, M.D.    Medications: Scheduled Meds:   . sodium chloride   Intravenous STAT  . amLODipine  10 mg Oral Daily  . aspirin EC  81 mg  Oral Daily  . cloNIDine  0.3 mg Oral BID  . docusate sodium  100 mg Oral BID  . enoxaparin  40 mg Subcutaneous Q24H  . hydrALAZINE  25 mg Oral Q6H  . hydrochlorothiazide  12.5 mg Oral Daily  . influenza  inactive virus vaccine  0.5 mL Intramuscular Tomorrow-1000  . lisinopril  10 mg Oral Once  . lisinopril  20 mg Oral Daily  . sodium chloride  3 mL Intravenous Q12H  . DISCONTD: hydrALAZINE  10 mg Oral Q6H  . DISCONTD: lisinopril  10 mg Oral Daily   Continuous Infusions:  PRN Meds:.acetaminophen, acetaminophen,  hydrALAZINE, nitroGLYCERIN, ondansetron (ZOFRAN) IV, traZODone, DISCONTD: nitroGLYCERIN Antibiotics: Anti-infectives    None       Assessment/Plan:  Active Problems:  Hypertension  Cardiomegaly - hypertensive  Chronic constipation  Lactose intolerance  Smoker   1. Syncopal episode:  suspect medication effect with antihypertensive regimen/bradycardia on BB. ACS ruled out with negative serial enzymes. CT head negative for acute neurological event. Carotid u/s negative for stenosis. No evidence of infection. Await 2DEcho.   2. Accelerated hypertension:  Amlodipine and hydralazine added to home regimen of ACEI/diuretic/clonidine. Unfortunately, pt is not able to tolerate BB due to HR 50s-60s. Will d/c hctz for now (c renal insufficiency), continue ACEI and amlodipine. Increase Clonidine to TID. Can increased po hydralazine if needed. Continue prn IV hydralazine.   3. EKG changes:  ruled out for ACS, but does have diffuse Twave inversion that is new from 09/2011 ekg. Given pt's multiple risk factors for CAD and poor potential for f/u, have asked Dr. Nadara Eaton to evaluate during inpt stay. Pt did describe [place]. Continue ASA  3. Acute Kidney injury: likely prerenal in nature with ? Of mild dehydration on admit and with medication effect. Cr 1.27 <--1.47 on admit. D/c hctz. Monitor on ACEI. SLIV  4. Bradycardia:  stable off BB  5. Leukocytosis:  ? Reactive with #1 as no evidence of infx etiology. WBC 12.3<--13.7 on admit. Pt afebrile, NT appearing. CXR and u/a are unremarkable.  6. Hypokalemia, mild: replete po  7. Marijuana abuse: pt counseled  8. Anxiety: will add low dose alprazolam for now.   9. Hx constipation: stable. Continue colace.  10. Prophylaxis: on SQ lovenox for DVT proph.  11. Dispo: CM to consult re: outpt follow-up. Await cardiology recs.  Cordelia Pen, NP-C [facility]  System  pgr [phone]      LOS: 1 day    03/16/2012, 7:58 AM

## 2012-03-16 NOTE — Progress Notes (Signed)
Patient seen and examined. Data reviewed. I agree exam, assessment and plan

## 2012-03-16 NOTE — Consult Note (Signed)
CARDIOLOGY CONSULT NOTE  Patient ID: Cory Rivera MRN: [id number] DOB/AGE: 40-Jul-1972 40 y.o.  Admit date: 03/15/2012 Referring Physician: Cordelia Pen, NP-C Primary Physician: No primary provider on file. Reason for Consultation Syncope  HPI: Patient is a 41 year old African American male with long-standing history of hypertension who has been compliant with taking medications. She has had difficulty in control hypertension. He also has not been seeing physicians on a routine basis due to insurance reasons. He sees [facility] for hypertension. He was recently prescribed a new medication. He taken the new medication and was sitting on the chair when his symptoms are slumped and was noted by his girlfriend roll up his eyes. Just prior to the episode he complained of dizziness. Since then he felt extremely weak after he woke up and was confused for a very brief period of time. He felt diaphoretic, mildly dyspneic. The episode lasted just a brief moment of seconds. There was no incontinence. There was no involuntary movements of the body. He was admitted for evaluation of syncope, rule out for myocardial infarction due to an abnormal EKG and had an echocardiogram done this admission which has not been read yet. Presently he is asymptomatic and laying in his bed comfortably without any acute distress.    Past Medical History  Diagnosis Date  . Hypertension      History reviewed. No pertinent past surgical history.   History reviewed. No pertinent family history.  Social History: History   Social History  . Marital Status: Single    Spouse Name: N/A    Number of Children: N/A  . Years of Education: N/A   Occupational History  . Not on file.   Social History Main Topics  . Smoking status: Current Everyday Smoker  . Smokeless tobacco: Not on file   Comment: smokes marijuana daily, no tobacco  . Alcohol Use: Yes  . Drug Use: Not on file  . Sexually Active: Not on file    Other Topics Concern  . Not on file   Social History Narrative  . No narrative on file     Prescriptions prior to admission  Medication Sig Dispense Refill  . aspirin-acetaminophen-caffeine (EXCEDRIN MIGRAINE) 250-250-65 MG per tablet Take 2-3 tablets by mouth daily as needed. For pain.       Marland Kitchen atenolol (TENORMIN) 50 MG tablet Take 0.5 tablets (25 mg total) by mouth daily.  30 tablet  0  . cloNIDine (CATAPRES) 0.3 MG tablet Take 1 tablet (0.3 mg total) by mouth 2 (two) times daily.  90 tablet  0  . lisinopril (PRINIVIL,ZESTRIL) 20 MG tablet Take 20 mg by mouth daily.      Marland Kitchen triamterene-hydrochlorothiazide (MAXZIDE-25) 37.5-25 MG per tablet Take 1 each (1 tablet total) by mouth daily.  30 tablet  0    ROS: General: no fevers/chills/night sweats Eyes: no blurry vision, diplopia, or amaurosis ENT: no sore throat or hearing loss Resp: no cough, wheezing, or hemoptysis CV: no edema or palpitations GI: no abdominal pain, nausea, vomiting, diarrhea, has chronic constipation. GU: no dysuria, frequency, or hematuria Skin: no rash Neuro: no headache, numbness, tingling, or weakness of extremities Musculoskeletal: no joint pain or swelling Heme: no bleeding, DVT, or easy bruising Endo: no polydipsia or polyuria    Physical Exam: Blood pressure 133/88, pulse 90, temperature 98.5 F (36.9 C), temperature source Oral, resp. rate 20, height 5\' 7"  (1.702 m), weight 91.4 kg (201 lb 8 oz), SpO2 99.00%.  Pt is alert and oriented, WD,  WN, in no distress. HEENT: normal Neck: JVP normal. Carotid upstrokes normal without bruits. No thyromegaly. Lungs: equal expansion, clear bilaterally CV: Apex is discrete and nondisplaced, RRR without murmur or gallop Abd: soft, NT, +BS, no bruit, no hepatosplenomegaly Back: no CVA tenderness Ext: no C/C/E        Femoral pulses 2+= without bruits        DP/PT pulses intact and = Skin: warm and dry without rash Neuro: CNII-XII intact             Strength  intact = bilaterally  Labs:   Lab Results  Component Value Date   WBC 12.3* 03/16/2012   HGB 15.6 03/16/2012   HCT 45.6 03/16/2012   MCV 92.5 03/16/2012   PLT 197 03/16/2012    Lab 03/16/12 0420  NA 139  K 3.3*  CL 103  CO2 29  BUN 17  CREATININE 1.27  CALCIUM 9.2  PROT 6.8  BILITOT 1.2  ALKPHOS 50  ALT 27  AST 28  GLUCOSE 96   Lab Results  Component Value Date   CKTOTAL 483* 03/15/2012   CKMB 2.7 03/15/2012   TROPONINI <0.30 03/15/2012    Lab Results  Component Value Date   CHOL 164 03/16/2012   Lab Results  Component Value Date   HDL 41 03/16/2012   Lab Results  Component Value Date   LDLCALC 102* 03/16/2012   Lab Results  Component Value Date   TRIG 103 03/16/2012   Lab Results  Component Value Date   CHOLHDL 4.0 03/16/2012   No results found for this basename: LDLDIRECT      Radiology: Chest x-ray was within normal limits and CT scan of the head was within normal limits.  EKG: Normal sinus rhythm, left hypertrophy with repolarization abnormality, cannot exclude lateral ischemia. Carotid duplex reveals no evidence of carotid stenosis.  ASSESSMENT AND PLAN:  1. Syncope probably vasovagal syncope. The event was preceded by dizziness. There is no seizure disorder history or involuntary movements of the body or incontinence. I also suspect syncope due to uncontrolled hypertension and hypertensive urgency. 2. Hypertension with hypertensive heart disease with EKG abnormalities history of definite hypertrophy with strain.  Recommendation: At this point patient needs to establish himself with a primary care to followup on his hypertension. I would recommend adding carvedilol 6.25 mg by mouth twice a day along with Aldactone 25 mg by mouth daily to his present medical regimen. I will give him a dose tonight. The echocardiogram has not been assigned in my name and outside to look into this. If the blood pressure is stable and echocardiogram does not reveal any kind of  cardiomyopathy which could be due to hypertension with hypertensive heart disease that he could be discharged safely home. I be happy to follow him in the outpatient basis for management of his hypertension. He suggested to contact me if they have any further questions. I have outlined discussed with the patient and his go from there at the bedside regarding the importance of salt restriction and avoidance of salty food.  Pamella Pert, MD 03/16/2012, 8:06 PM P: [phone]

## 2012-03-17 LAB — BASIC METABOLIC PANEL
BUN: 19 mg/dL (ref 6–23)
CO2: 26 mEq/L (ref 19–32)
CO2: 27 mEq/L (ref 19–32)
Calcium: 9.3 mg/dL (ref 8.4–10.5)
Chloride: 103 mEq/L (ref 96–112)
Creatinine, Ser: 1.37 mg/dL — ABNORMAL HIGH (ref 0.50–1.35)
Glucose, Bld: 131 mg/dL — ABNORMAL HIGH (ref 70–99)
Sodium: 137 mEq/L (ref 135–145)

## 2012-03-17 LAB — CBC
HCT: 44.7 % (ref 39.0–52.0)
MCV: 94.5 fL (ref 78.0–100.0)
Platelets: 184 10*3/uL (ref 150–400)
RBC: 4.73 MIL/uL (ref 4.22–5.81)
WBC: 11.2 10*3/uL — ABNORMAL HIGH (ref 4.0–10.5)

## 2012-03-17 MED ORDER — CARVEDILOL 6.25 MG PO TABS: 6.2500 mg | ORAL_TABLET | Freq: Two times a day (BID) | ORAL | Status: DC

## 2012-03-17 MED ORDER — LORAZEPAM 0.5 MG PO TABS: 0.5000 mg | ORAL_TABLET | Freq: Three times a day (TID) | ORAL | Status: AC | PRN

## 2012-03-17 MED ORDER — CLONIDINE HCL 0.3 MG PO TABS: 0.3000 mg | ORAL_TABLET | Freq: Two times a day (BID) | ORAL | Status: DC

## 2012-03-17 MED ORDER — LORAZEPAM 0.5 MG PO TABS: 0.5000 mg | ORAL_TABLET | Freq: Three times a day (TID) | ORAL | Status: DC | PRN

## 2012-03-17 MED ORDER — DSS 100 MG PO CAPS: 100.0000 mg | ORAL_CAPSULE | Freq: Two times a day (BID) | ORAL | Status: AC

## 2012-03-17 MED ORDER — HYDRALAZINE HCL 25 MG PO TABS: 25.0000 mg | ORAL_TABLET | Freq: Four times a day (QID) | ORAL | Status: DC

## 2012-03-17 MED ORDER — LISINOPRIL 20 MG PO TABS: 20.0000 mg | ORAL_TABLET | Freq: Every day | ORAL | Status: DC

## 2012-03-17 MED ORDER — ASPIRIN 81 MG PO TBEC: 81.0000 mg | DELAYED_RELEASE_TABLET | Freq: Every day | ORAL | Status: DC

## 2012-03-17 MED ORDER — SPIRONOLACTONE 25 MG PO TABS: 25.0000 mg | ORAL_TABLET | Freq: Every day | ORAL | Status: DC

## 2012-03-17 NOTE — Discharge Summary (Signed)
Physician Discharge Summary  Cory Rivera ZOX:[id number] DOB: August 15, 1971 DOA: 03/15/2012  PCP: No primary provider on file.provided with [facility] and [name] numbers Cardiologist: [name], MD  Admit date: 03/15/2012 Discharge date: 03/17/2012  Discharge Diagnoses:  1. Syncope, vasovagal 2. Uncontrolled hypertension 3. 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 Polysubstance abuse  Discharge Condition: Improved  Disposition: Home  History of present illness:  41 year old man presented with history of syncopal episode while he was sitting in a chair. Took an extra dose of clonidine because he thought his blood pressure is high.   Hospital Course:  [name] was admitted to the medical floor. No recurrence of syncope. Blood pressure initially was high but improved. His antihypertensives were adjusted and his blood pressure is much better control. Workup for syncope was unremarkable. He was seen by cardiology in consultation. He will followup with cardiology in the outpatient setting. Syncope is most likely vasovagal in nature or possibly secondary to next dose of clonidine he took. 1. Syncope: Likely vasovagal or secondary to extra dose of clonidine. Uncontrolled hypertension may have contributed as well. 2-D echocardiogram and bilateral carotid Dopplers unremarkable. Cardiac enzymes negative. CT head negative for acute event. No further evaluation suggested.  2. Uncontrolled hypertension: Improved with current medications. Follow up with cardiology as an outpatient.  3. EKG changes: Secondary to left ventricular hypertrophy with repolarization abnormality.  4. 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 Follow up as an outpatient. Hydrochlorothiazide discontinued. ACE inhibitor continued.  5. Anxiety: Low-dose Ativan.  6. Marijuana use: Cessation recommended.  7. Cigarette smoker: Cessation  recommended.  Consultants:  Cardiology  Procedures:  March 24: Bilateral carotid Dopplers: No ICA stenosis. Vertebral artery flow antegrade.   March 24: 2-D echocardiogram: Normal systolic function. Normal wall motion.  Discharge Instructions  Discharge Orders    Future Orders Please Complete By Expires   Diet - low sodium heart healthy      Activity as tolerated - No restrictions        Medication List  As of 03/17/2012  1:13 PM   STOP taking these medications         aspirin-acetaminophen-caffeine 250-250-65 MG per tablet      atenolol 50 MG tablet      triamterene-hydrochlorothiazide 37.5-25 MG per tablet         TAKE these medications         aspirin 81 MG EC tablet   Take 1 tablet (81 mg total) by mouth daily.      carvedilol 6.25 MG tablet   Commonly known as: COREG   Take 1 tablet (6.25 mg total) by mouth 2 (two) times daily with a meal.      cloNIDine 0.3 MG tablet   Commonly known as: CATAPRES   Take 1 tablet (0.3 mg total) by mouth 2 (two) times daily.      DSS 100 MG Caps   Take 100 mg by mouth 2 (two) times daily.      hydrALAZINE 25 MG tablet   Commonly known as: APRESOLINE   Take 1 tablet (25 mg total) by mouth 4 (four) times daily.      lisinopril 20 MG tablet   Commonly known as: PRINIVIL,ZESTRIL   Take 1 tablet (20 mg total) by mouth daily.      LORazepam 0.5 MG tablet   Commonly known as: ATIVAN   Take 1 tablet (0.5 mg total) by mouth every 8 (eight) hours as needed for anxiety.  spironolactone 25 MG tablet   Commonly known as: ALDACTONE   Take 1 tablet (25 mg total) by mouth daily.           Follow-up Information    Follow up with Pamella Pert, MD. Schedule an appointment as soon as possible for a visit in 1 week.   Contact information:   [address]  [place] [address] [phone]          The results of significant diagnostics from this hospitalization (including imaging,  microbiology, ancillary and laboratory) are listed below for reference.    Significant Diagnostic Studies: Dg Chest 2 View  03/15/2012  *RADIOLOGY REPORT*  Clinical Data: Syncope, hypertension  CHEST - 2 VIEW  Comparison: None  Findings: Borderline enlargement of cardiac silhouette. Mediastinal contours and pulmonary vascularity normal. Lungs clear. No pleural effusion or pneumothorax. Bones unremarkable.  IMPRESSION: No acute abnormalities.  Original Report Authenticated By: Lollie Marrow, M.D.   Abd 1 View (kub)  03/15/2012  *RADIOLOGY REPORT*  Clinical Data: Chronic constipation.  Abdominal pain.  ABDOMEN - 1 VIEW 03/15/2012:  Comparison: None.  Findings: Bowel gas pattern unremarkable without evidence of obstruction or significant ileus.  No suggestion of free air on the supine image.  No visible opaque urinary tract calculi.  Phlebolith in the left side of the pelvis.  Regional skeleton unremarkable.  IMPRESSION: No acute abdominal abnormality.  Original Report Authenticated By: Arnell Sieving, M.D.   Ct Head Wo Contrast  03/15/2012  *RADIOLOGY REPORT*  Clinical Data: Syncope, dizziness, lightheaded, loss of consciousness, hypertension  CT HEAD WITHOUT CONTRAST  Technique:  Contiguous axial images were obtained from the base of the skull through the vertex without contrast.  Comparison: None  Findings: Minimal atrophy for age. Normal ventral morphology. No midline shift or mass effect. No intracranial hemorrhage, mass lesion or evidence of acute infarction. No extra-axial fluid collections. Visualized paranasal sinuses and mastoid air cells clear. Bones unremarkable.  IMPRESSION: No acute intracranial abnormalities.  Original Report Authenticated By: Lollie Marrow, M.D.   Labs: Basic Metabolic Panel:  Lab 03/17/12 7253 03/17/12 03/16/12 0420 03/15/12 0334  NA 139 137 139 137  K 3.9 3.5 -- --  CL 103 102 103 99  CO2 27 26 29 29   GLUCOSE 94 131* 96 109*  BUN 19 17 17 23   CREATININE 1.37*  1.24 1.27 1.47*  CALCIUM 9.2 9.3 9.2 10.2  MG -- -- -- --  PHOS -- -- -- --   Liver Function Tests:  Lab 03/16/12 0420  AST 28  ALT 27  ALKPHOS 50  BILITOT 1.2  PROT 6.8  ALBUMIN 3.8   CBC:  Lab 03/17/12 0439 03/16/12 0420 03/15/12 0334  WBC 11.2* 12.3* 13.7*  NEUTROABS -- -- 8.1*  HGB 15.0 15.6 16.6  HCT 44.7 45.6 47.2  MCV 94.5 92.5 93.3  PLT 184 197 190   Cardiac Enzymes:  Lab 03/15/12 2350 03/15/12 1552 03/15/12 0910 03/15/12 0334  CKTOTAL 483* 515* 499* --  CKMB 2.7 3.2 3.5 --  CKMBINDEX -- -- -- --  TROPONINI <0.30 <0.30 <0.30 <0.30    Time coordinating discharge: 35 minutes.  Signed:  Brendia Sacks, MD  [facility] 03/17/2012, 1:13 PM

## 2012-03-17 NOTE — Progress Notes (Signed)
   CARE MANAGEMENT NOTE 03/17/2012  Patient:  DYLANN, GALLIER   Account Number:  [id number]  Date Initiated:  03/16/2012  Documentation initiated by:  Lanier Clam  Subjective/Objective Assessment:   ADMITTED W/LOSS OF CONSCIOUNESS,HTN.     Action/Plan:   FROM HOME.WORKS.[employer],SUPPORT,TRANSP.   Anticipated DC Date:  03/17/2012   Anticipated DC Plan:  HOME/SELF CARE      DC Planning Services  [facility]      Choice offered to / List presented to:             Status of service:  Completed, signed off Medicare Important Message given?   (If response is "NO", the following Medicare IM given date fields will be blank) Date Medicare IM given:   Date Additional Medicare IM given:    Discharge Disposition:  HOME/SELF CARE  Per UR Regulation:  Reviewed for med. necessity/level of care/duration of stay  If discussed at Long Length of Stay Meetings, dates discussed:    Comments:  03/16/12 [facility] RN,BSN NCM [phone] PROVIDED W/[facility] TEL# FOR PCP.

## 2012-03-17 NOTE — Progress Notes (Signed)
PROGRESS NOTE  Cory Rivera ZOX:[id number] DOB: [birth year] DOA: 03/15/2012 PCP: No primary provider on file. provided with [facility] and [name] numbers  Cardiologist: [name], MD  Brief narrative: 41 year old man presented with history of syncopal episode while he was sitting in a chair. Took an extra dose of clonidine because he thought his blood pressure is high.   Chart review:  03/07/2012 emergency department visit: Presented for blood pressure medication refill.  Past medical history: Hypertension  Consultants:  Cardiology  Procedures:  March 24: Bilateral carotid Dopplers: No ICA stenosis. Vertebral artery flow antegrade.  March 24: 2-D echocardiogram: Normal systolic function. Normal wall motion.  Interim History: Chart reviewed in detail and summarized as above. Blood pressure much better controlled.  Subjective: Feels good. No complaints.  Objective: Filed Vitals:   03/16/12 1758 03/16/12 2230 03/17/12 0028 03/17/12 0646  BP: 133/88 149/90 124/83 144/92  Pulse: 90 85 83 87  Temp:  98.5 F (36.9 C) 98.2 F (36.8 C) 98.6 F (37 C)  TempSrc:  Oral Oral Oral  Resp: 20 19 19 19   Height:      Weight:      SpO2: 99% 99% 98% 99%    Intake/Output Summary (Last 24 hours) at 03/17/12 1230 Last data filed at 03/17/12 0653  Gross per 24 hour  Intake    360 ml  Output   1675 ml  Net  -1315 ml    Exam:   General:  Appears calm and comfortable.  Cardiovascular: Regular rate and rhythm. No murmur, rub, gallop.  Telemetry: Sinus rhythm. Episodes of sinus tachycardia.  Respiratory: Clear to auscultation bilaterally. No wheezes, rales, rhonchi. Normal respiratory effort.  Psychiatric: Grossly normal mood and affect. Speech fluent and appropriate.  Data Reviewed: Basic Metabolic Panel:  Lab 03/17/12 8119 03/17/12 03/16/12 0420 03/15/12 0334  NA 139 137 139 137  K 3.9 3.5 -- --  CL 103 102 103 99  CO2 27 26 29 29   GLUCOSE 94  131* 96 109*  BUN 19 17 17 23   CREATININE 1.37* 1.24 1.27 1.47*  CALCIUM 9.2 9.3 9.2 10.2  MG -- -- -- --  PHOS -- -- -- --   Liver Function Tests:  Lab 03/16/12 0420  AST 28  ALT 27  ALKPHOS 50  BILITOT 1.2  PROT 6.8  ALBUMIN 3.8   CBC  Lab 03/17/12 0439 03/16/12 0420 03/15/12 0334  WBC 11.2* 12.3* 13.7*  NEUTROABS -- -- 8.1*  HGB 15.0 15.6 16.6  HCT 44.7 45.6 47.2  MCV 94.5 92.5 93.3  PLT 184 197 190   Cardiac Enzymes:  Lab 03/15/12 2350 03/15/12 1552 03/15/12 0910 03/15/12 0334  CKTOTAL 483* 515* 499* --  CKMB 2.7 3.2 3.5 --  CKMBINDEX -- -- -- --  TROPONINI <0.30 <0.30 <0.30 <0.30   Studies: Dg Chest 2 View  03/15/2012  *RADIOLOGY REPORT*  Clinical Data: Syncope, hypertension  CHEST - 2 VIEW  Comparison: None  Findings: Borderline enlargement of cardiac silhouette. Mediastinal contours and pulmonary vascularity normal. Lungs clear. No pleural effusion or pneumothorax. Bones unremarkable.  IMPRESSION: No acute abnormalities.  Original Report Authenticated By: Lollie Marrow, M.D.   Abd 1 View (kub)  03/15/2012  *RADIOLOGY REPORT*  Clinical Data: Chronic constipation.  Abdominal pain.  ABDOMEN - 1 VIEW 03/15/2012:  Comparison: None.  Findings: Bowel gas pattern unremarkable without evidence of obstruction or significant ileus.  No suggestion of free air on the supine image.  No visible opaque urinary tract calculi.  Phlebolith in the left side of the pelvis.  Regional skeleton unremarkable.  IMPRESSION: No acute abdominal abnormality.  Original Report Authenticated By: Arnell Sieving, M.D.   Ct Head Wo Contrast  03/15/2012  *RADIOLOGY REPORT*  Clinical Data: Syncope, dizziness, lightheaded, loss of consciousness, hypertension  CT HEAD WITHOUT CONTRAST  Technique:  Contiguous axial images were obtained from the base of the skull through the vertex without contrast.  Comparison: None  Findings: Minimal atrophy for age. Normal ventral morphology. No midline shift or mass  effect. No intracranial hemorrhage, mass lesion or evidence of acute infarction. No extra-axial fluid collections. Visualized paranasal sinuses and mastoid air cells clear. Bones unremarkable.  IMPRESSION: No acute intracranial abnormalities.  Original Report Authenticated By: Lollie Marrow, M.D.   Scheduled Meds:   . amLODipine  10 mg Oral Daily  . aspirin EC  81 mg Oral Daily  . carvedilol  6.25 mg Oral BID WC  . cloNIDine  0.3 mg Oral TID  . docusate sodium  100 mg Oral BID  . enoxaparin  40 mg Subcutaneous Q24H  . hydrALAZINE  25 mg Oral Q6H  . influenza  inactive virus vaccine  0.5 mL Intramuscular Tomorrow-1000  . influenza  inactive virus vaccine  0.5 mL Intramuscular Tomorrow-1000  . lisinopril  20 mg Oral Daily  . sodium chloride  3 mL Intravenous Q12H  . spironolactone  25 mg Oral Daily   Continuous Infusions:    Assessment/Plan: 1. Syncope: Likely vasovagal or secondary to extra dose of clonidine. Uncontrolled hypertension may have contributed as well. 2-D echocardiogram and bilateral carotid Dopplers unremarkable. Cardiac enzymes negative. CT head negative for acute event. No further evaluation suggested. 2. Uncontrolled hypertension: Improved with current medications. Follow up with cardiology as an outpatient. 3. EKG changes: Secondary to left ventricular hypertrophy with repolarization abnormality. 4. 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 Follow up as an outpatient. Hydrochlorothiazide discontinued. ACE inhibitor continued. 5. Anxiety: Low-dose Ativan. 6. Marijuana use: Cessation recommended. 7. Cigarette smoker: Cessation recommended.  Code Status: Full code Family Communication: None the bedside Disposition Plan: Home   Brendia Sacks, MD  [facility] Pager [phone] 03/17/2012, 12:30 PM    LOS: 2 days

## 2012-04-01 ENCOUNTER — Other Ambulatory Visit: Payer: Self-pay

## 2012-04-01 ENCOUNTER — Emergency Department (HOSPITAL_COMMUNITY)
Admission: EM | Admit: 2012-04-01 | Discharge: 2012-04-01 | Discharge status: Against Medical Advice | Payer: PRIVATE HEALTH INSURANCE | Attending: Emergency Medicine | Admitting: Emergency Medicine

## 2012-04-01 ENCOUNTER — Encounter (HOSPITAL_COMMUNITY): Payer: Self-pay | Admitting: Emergency Medicine

## 2012-04-01 ENCOUNTER — Emergency Department (HOSPITAL_COMMUNITY): Payer: PRIVATE HEALTH INSURANCE

## 2012-04-01 DIAGNOSIS — F17200 Nicotine dependence, unspecified, uncomplicated: Secondary | ICD-10-CM | POA: Insufficient documentation

## 2012-04-01 DIAGNOSIS — I10 Essential (primary) hypertension: Secondary | ICD-10-CM | POA: Insufficient documentation

## 2012-04-01 LAB — URINALYSIS, ROUTINE W REFLEX MICROSCOPIC
Glucose, UA: NEGATIVE mg/dL
Hgb urine dipstick: NEGATIVE
Ketones, ur: 15 mg/dL — AB
Protein, ur: NEGATIVE mg/dL
pH: 6 (ref 5.0–8.0)

## 2012-04-01 LAB — BASIC METABOLIC PANEL
CO2: 27 mEq/L (ref 19–32)
Calcium: 9.7 mg/dL (ref 8.4–10.5)
Chloride: 101 mEq/L (ref 96–112)
Creatinine, Ser: 1.67 mg/dL — ABNORMAL HIGH (ref 0.50–1.35)
Glucose, Bld: 105 mg/dL — ABNORMAL HIGH (ref 70–99)

## 2012-04-01 LAB — DIFFERENTIAL
Eosinophils Relative: 1 % (ref 0–5)
Lymphocytes Relative: 43 % (ref 12–46)
Lymphs Abs: 4.9 10*3/uL — ABNORMAL HIGH (ref 0.7–4.0)
Monocytes Absolute: 0.8 10*3/uL (ref 0.1–1.0)
Monocytes Relative: 7 % (ref 3–12)

## 2012-04-01 LAB — URINE MICROSCOPIC-ADD ON

## 2012-04-01 LAB — POCT I-STAT TROPONIN I: Troponin i, poc: 0 ng/mL (ref 0.00–0.08)

## 2012-04-01 LAB — CBC
HCT: 42.7 % (ref 39.0–52.0)
Hemoglobin: 14.6 g/dL (ref 13.0–17.0)
MCV: 93.6 fL (ref 78.0–100.0)
RBC: 4.56 MIL/uL (ref 4.22–5.81)
WBC: 11.6 10*3/uL — ABNORMAL HIGH (ref 4.0–10.5)

## 2012-04-01 NOTE — ED Notes (Signed)
Pt taken to xray 

## 2012-04-01 NOTE — ED Notes (Signed)
Pt presents with htn, sts has been a problem for a long time, he is taking his original meds, clonidine and lisinopril was recently started on a new medicine unk name and unable to take it due to it makes him very sick. But does take all other meds as directed. Recently in hospital for same, pt has appt with his MD on Friday. sts last week he had similar issue and felt faint and passed out, denies pain, sts does feel jittery and anxious. 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

## 2012-04-01 NOTE — ED Notes (Signed)
Was in another pts rooms and when I came out Erskine Squibb NT stated that pt and wife left. Went to go in room and pt was no longer in room. Notified Dr. Nicanor Alcon and she stated to place him under AMA.

## 2012-04-01 NOTE — ED Notes (Signed)
PT. REPORTS ELEVATED BLOOD PRESSURE AT HOME THIS EVENING = 245/ 148, DENIES PAIN OR DISCOMFORT .

## 2012-04-01 NOTE — ED Provider Notes (Signed)
History     CSN: [id number]  Arrival date & time 04/01/12  0116   First MD Initiated Contact with Patient 04/01/12 0411      Chief Complaint  Patient presents with  . Hypertension    (Consider location/radiation/quality/duration/timing/severity/associated sxs/prior treatment) Patient is a 41 y.o. male presenting with hypertension. The history is provided by the patient. No language interpreter was used.  Hypertension This is a chronic problem. The current episode started more than 1 week ago. The problem occurs constantly. The problem has not changed since onset.Pertinent negatives include no chest pain, no abdominal pain, no headaches and no shortness of breath. The symptoms are aggravated by nothing. The symptoms are relieved by nothing. He has tried nothing for the symptoms. The treatment provided no relief.  Reports BP 245/148 at home.  Took evening meds and then came in.  Mild lightheadedness.  No CP No SOb no HA.  No weakness no numbness no focal neuro deficits.    Past Medical History  Diagnosis Date  . Hypertension     History reviewed. No pertinent past surgical history.  No family history on file.  History  Substance Use Topics  . Smoking status: Current Everyday Smoker  . Smokeless tobacco: Not on file   Comment: smokes marijuana daily, no tobacco  . Alcohol Use: Yes      Review of Systems  Constitutional: Negative.   HENT: Negative.  Negative for neck stiffness.   Eyes: Negative.   Respiratory: Negative for cough, chest tightness and shortness of breath.   Cardiovascular: Negative for chest pain, palpitations and leg swelling.  Gastrointestinal: Negative.  Negative for abdominal pain.  Genitourinary: Negative.   Musculoskeletal: Negative.   Skin: Negative.   Neurological: Negative for speech difficulty, weakness, numbness and headaches.  Hematological: Negative.   Psychiatric/Behavioral: Negative.     Allergies  Review of patient's allergies  indicates no known allergies.  Home Medications   Current Outpatient Rx  Name Route Sig Dispense Refill  . ASPIRIN 81 MG PO TBEC Oral Take 1 tablet (81 mg total) by mouth daily.    Marland Kitchen CLONIDINE HCL 0.3 MG PO TABS Oral Take 1 tablet (0.3 mg total) by mouth 2 (two) times daily. 60 tablet 0  . LISINOPRIL 20 MG PO TABS Oral Take 1 tablet (20 mg total) by mouth daily. 30 tablet 0    BP 151/99  Pulse 60  Temp(Src) 98.3 F (36.8 C) (Oral)  Resp 20  SpO2 100%  Physical Exam  Constitutional: He is oriented to person, place, and time. He appears well-developed and well-nourished. No distress.  HENT:  Head: Normocephalic and atraumatic.  Mouth/Throat: No oropharyngeal exudate.  Eyes: Conjunctivae are normal. Pupils are equal, round, and reactive to light.  Neck: Normal range of motion. Neck supple.  Cardiovascular: Normal rate and regular rhythm.   Pulmonary/Chest: Effort normal and breath sounds normal. He has no wheezes. He has no rales.  Abdominal: Soft. Bowel sounds are normal. There is no tenderness. There is no rebound and no guarding.  Musculoskeletal: Normal range of motion.  Neurological: He is alert and oriented to person, place, and time. No cranial nerve deficit.  Skin: Skin is warm and dry.  Psychiatric: He has a normal mood and affect.    ED Course  Procedures (including critical care time)  Labs Reviewed  CBC - Abnormal; Notable for the following:    WBC 11.6 (*)    All other components within normal limits  DIFFERENTIAL - Abnormal; Notable  for the following:    Lymphs Abs 4.9 (*)    All other components within normal limits  BASIC METABOLIC PANEL - Abnormal; Notable for the following:    Glucose, Bld 105 (*)    Creatinine, Ser 1.67 (*)    GFR calc non Af Amer 50 (*)    GFR calc Af Amer 58 (*)    All other components within normal limits  URINALYSIS, ROUTINE W REFLEX MICROSCOPIC - Abnormal; Notable for the following:    Ketones, ur 15 (*)    Leukocytes, UA TRACE  (*)    All other components within normal limits  URINE MICROSCOPIC-ADD ON  POCT I-STAT TROPONIN I   Dg Chest 2 View  04/01/2012  *RADIOLOGY REPORT*  Clinical Data: Shortness of breath; hypertension.  CHEST - 2 VIEW  Comparison: Chest radiograph performed 03/15/2012  Findings: The lungs are well-aerated.  There is no evidence of focal opacification, pleural effusion or pneumothorax.  Small bilateral calcified granulomata reflect remote granulomatous disease.  The heart is normal in size; the mediastinal contour is within normal limits.  No acute osseous abnormalities are seen.  IMPRESSION: No acute cardiopulmonary process seen.  Original Report Authenticated By: Tonia Ghent, M.D.     No diagnosis found.    MDM   Date: 04/01/2012  Rate: 53  Rhythm: normal sinus rhythm  QRS Axis: normal  Intervals: normal  ST/T Wave abnormalities: nonspecific ST changes  Conduction Disutrbances:none  Narrative Interpretation:   Old EKG Reviewed: changes noted       Left during evaluation  Neena Beecham K Nalda Shackleford-Rasch, MD 04/01/12 [phone]

## 2012-07-22 ENCOUNTER — Encounter (HOSPITAL_COMMUNITY): Payer: Self-pay | Admitting: *Deleted

## 2012-07-22 ENCOUNTER — Emergency Department (HOSPITAL_COMMUNITY)
Admission: EM | Admit: 2012-07-22 | Discharge: 2012-07-22 | Disposition: A | Discharge status: Home / Self Care | Payer: Self-pay | Attending: Emergency Medicine | Admitting: Emergency Medicine

## 2012-07-22 DIAGNOSIS — I10 Essential (primary) hypertension: Secondary | ICD-10-CM | POA: Insufficient documentation

## 2012-07-22 DIAGNOSIS — H00019 Hordeolum externum unspecified eye, unspecified eyelid: Secondary | ICD-10-CM | POA: Insufficient documentation

## 2012-07-22 DIAGNOSIS — F17200 Nicotine dependence, unspecified, uncomplicated: Secondary | ICD-10-CM | POA: Insufficient documentation

## 2012-07-22 NOTE — ED Notes (Addendum)
Rt. Upper eye swelling and trying to spreading lower eye lid. No visual problems. 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

## 2012-07-22 NOTE — ED Provider Notes (Signed)
History    This chart was scribed for Toy Baker, MD, MD by Smitty Pluck. The patient was seen in room TR02C and the patient's care was started at 12:21PM.   CSN: [id number]  Arrival date & time 07/22/12  1155   None     Chief Complaint  Patient presents with  . Facial Swelling    (Consider location/radiation/quality/duration/timing/severity/associated sxs/prior treatment) The history is provided by the patient.   Cory Rivera is a 41 y.o. male who presents to the Emergency Department complaining of moderate right upper eyelid swelling radiating to lower eyelid onset 4 days ago. Denies fevers and visual disturbances. Pt reports using warm compresses within the last 2 days with relief. Symptoms have improved. Pt reports having new job and working in dusty environment. Denies any other symptoms.     Past Medical History  Diagnosis Date  . Hypertension     No past surgical history on file.  No family history on file.  History  Substance Use Topics  . Smoking status: Current Everyday Smoker  . Smokeless tobacco: Not on file   Comment: smokes marijuana daily, no tobacco  . Alcohol Use: Yes      Review of Systems  Constitutional: Negative for fever and chills.  Respiratory: Negative for shortness of breath.   Gastrointestinal: Negative for nausea and vomiting.  Neurological: Negative for weakness.  All other systems reviewed and are negative.    Allergies  Review of patient's allergies indicates no known allergies.  Home Medications   Current Outpatient Rx  Name Route Sig Dispense Refill  . ASPIRIN 81 MG PO TBEC Oral Take 1 tablet (81 mg total) by mouth daily.    Marland Kitchen CLONIDINE HCL 0.3 MG PO TABS Oral Take 1 tablet (0.3 mg total) by mouth 2 (two) times daily. 60 tablet 0  . LISINOPRIL 20 MG PO TABS Oral Take 1 tablet (20 mg total) by mouth daily. 30 tablet 0    BP 181/113  Pulse 91  Temp 97.7 F (36.5 C) (Oral)  Resp 16  SpO2 98%  Physical Exam    Nursing note and vitals reviewed. Constitutional: He is oriented to person, place, and time. He appears well-developed and well-nourished. No distress.  HENT:  Head: Normocephalic and atraumatic.  Eyes:       Right eye has no drainage  Sclera not injected Right cornea clear Top eyelid with erythema  Small, round shaped slightly firm mass on upper eyelid consistent with hordeolum    Neck: Normal range of motion. Neck supple.  Pulmonary/Chest: Effort normal. No respiratory distress.  Neurological: He is alert and oriented to person, place, and time.  Skin: Skin is warm and dry.  Psychiatric: He has a normal mood and affect. His behavior is normal.    ED Course  Procedures (including critical care time) DIAGNOSTIC STUDIES: Oxygen Saturation is 98% on room air, normal by my interpretation.    COORDINATION OF CARE: 12:30PM EDP discusses pt ED treatment with pt     Labs Reviewed - No data to display No results found.   No diagnosis found.    MDM  Pt given opthm referral   I personally performed the services described in this documentation, which was scribed in my presence. The recorded information has been reviewed and considered.        Toy Baker, MD 07/22/12 [phone]

## 2012-10-29 ENCOUNTER — Encounter (HOSPITAL_COMMUNITY): Payer: Self-pay | Admitting: Emergency Medicine

## 2012-10-29 ENCOUNTER — Emergency Department (HOSPITAL_COMMUNITY)
Admission: EM | Admit: 2012-10-29 | Discharge: 2012-10-29 | Disposition: A | Discharge status: Home / Self Care | Payer: Self-pay | Attending: Emergency Medicine | Admitting: Emergency Medicine

## 2012-10-29 DIAGNOSIS — I10 Essential (primary) hypertension: Secondary | ICD-10-CM | POA: Insufficient documentation

## 2012-10-29 DIAGNOSIS — F17200 Nicotine dependence, unspecified, uncomplicated: Secondary | ICD-10-CM | POA: Insufficient documentation

## 2012-10-29 DIAGNOSIS — L0291 Cutaneous abscess, unspecified: Secondary | ICD-10-CM

## 2012-10-29 DIAGNOSIS — Z79899 Other long term (current) drug therapy: Secondary | ICD-10-CM | POA: Insufficient documentation

## 2012-10-29 DIAGNOSIS — L02818 Cutaneous abscess of other sites: Secondary | ICD-10-CM | POA: Insufficient documentation

## 2012-10-29 MED ORDER — SULFAMETHOXAZOLE-TRIMETHOPRIM 800-160 MG PO TABS: 1.0000 | ORAL_TABLET | Freq: Two times a day (BID) | ORAL | Status: DC

## 2012-10-29 MED ORDER — OXYCODONE-ACETAMINOPHEN 5-325 MG PO TABS: 1.0000 | ORAL_TABLET | ORAL | Status: DC | PRN

## 2012-10-29 MED ORDER — OXYCODONE-ACETAMINOPHEN 5-325 MG PO TABS
2.0000 | ORAL_TABLET | Freq: Once | ORAL | Status: AC
  Administered 2012-10-29: 2 via ORAL
  Filled 2012-10-29: qty 2

## 2012-10-29 NOTE — ED Provider Notes (Signed)
History   This chart was scribed for Cory Quarry, MD by Cory Rivera, ER Scribe. This patient was seen in room TR07C/TR07C and the patient's care was started at 4:40 PM    CSN: [id number]  Arrival date & time 10/29/12  1528   None     Chief Complaint  Patient presents with  . Abscess    The history is provided by the patient. No language interpreter was used.   Cory Rivera is a 41 y.o. male who presents to the Emergency Department complaining of a severely painful abscess at the base of posterior head and neck gradually worsening over past 3 days.  Per pt, abscess has drained at times.  Pt has h/o HTN.  Pt reports daily marijuana use and alcohol use, but denies tobacco use.  Past Medical History  Diagnosis Date  . Hypertension     History reviewed. No pertinent past surgical history.  History reviewed. No pertinent family history.  History  Substance Use Topics  . Smoking status: Current Every Day Smoker  . Smokeless tobacco: Not on file     Comment: smokes marijuana daily, no tobacco  . Alcohol Use: Yes      Review of Systems  Respiratory: Negative for cough and shortness of breath.   Cardiovascular: Negative for chest pain.  Skin:       Abscess.    Allergies  Review of patient's allergies indicates no known allergies.  Home Medications   Current Outpatient Rx  Name  Route  Sig  Dispense  Refill  . CLONIDINE HCL 0.3 MG PO TABS   Oral   Take 0.3 mg by mouth 3 (three) times daily.           BP 141/91  Pulse 86  Temp 98.7 F (37.1 C) (Oral)  Resp 16  SpO2 94%  Physical Exam  Nursing note and vitals reviewed. Constitutional: He is oriented to person, place, and time. He appears well-developed and well-nourished.  HENT:  Head: Normocephalic and atraumatic.       7cm circular tender indurated area over right occiput.  No adenopathy.  Eyes: Conjunctivae normal and EOM are normal.  Neck: Normal range of motion. No tracheal deviation present.    Cardiovascular: Normal rate, regular rhythm and normal heart sounds.   Pulmonary/Chest: Effort normal. No respiratory distress.  Abdominal: He exhibits no distension.  Musculoskeletal: Normal range of motion. He exhibits no edema.  Neurological: He is alert and oriented to person, place, and time.  Skin: Skin is warm and dry.  Psychiatric: He has a normal mood and affect.    ED Course  INCISION AND DRAINAGE Date/Time: 10/29/2012 5:10 PM Performed by: Cory Rivera Authorized by: Cory Rivera Consent: Verbal consent obtained. Written consent not obtained. Risks and benefits: risks, benefits and alternatives were discussed Consent given by: patient Patient understanding: patient states understanding of the procedure being performed Patient identity confirmed: verbally with patient Time out: Immediately prior to procedure a "time out" was called to verify the correct patient, procedure, equipment, support staff and site/side marked as required. Type: abscess Body area: head/neck Location details: scalp Anesthesia: local infiltration Local anesthetic: lidocaine 1% with epinephrine Anesthetic total: 3 ml Patient sedated: no Scalpel size: 11 Incision type: single straight Complexity: complex Drainage: purulent Drainage amount: moderate Wound treatment: wound left open Patient tolerance: Patient tolerated the procedure well with no immediate complications.   (including critical care time) DIAGNOSTIC STUDIES: Oxygen Saturation is 94% on room air, adequate  by my interpretation.    COORDINATION OF CARE: 4:44 PM- Patient informed of clinical course, understands medical decision-making process, and agrees with plan.  Discussed I&D.        Labs Reviewed - No data to display No results found.   No diagnosis found.   I personally performed the services described in this documentation, which was scribed in my presence. The recorded information has been reviewed and is  accurate.   MDM  Patient with surrounding induration and cellulitis.  Plan bactrim and recheck with pmd 2-3 days.    Patient with occipital abscess treated here with i and d.  Patient given rx for bactrim and percocet.      Cory Quarry, MD 10/29/12 [phone]

## 2012-10-29 NOTE — ED Notes (Signed)
Pt here with abscess to back of head and neck x several days

## 2012-10-29 NOTE — ED Notes (Signed)
Pt c/o abscess to right side of back of scalp. Area is red, swollen, scabbed over on center. Pt denies any drainage from area. Pt states area came about 3 days ago. Area is tender to palpation.

## 2013-01-31 IMAGING — CT CT HEAD W/O CM
2 series · 17 of 30 positions shown, 20 images · non-contrast
Comparison: None

CLINICAL DATA: Syncope, dizziness, lightheaded, loss of
consciousness, hypertension

CT HEAD WITHOUT CONTRAST
TECHNIQUE: Contiguous axial images were obtained from the base of
the skull through the vertex without contrast.

[Series 2: head w/o · axial · non-contrast · 0.43mm/px · z∈[+78,+198]mm · 9 of 30 slices shown, 12 images]
[im 3/30  brain]
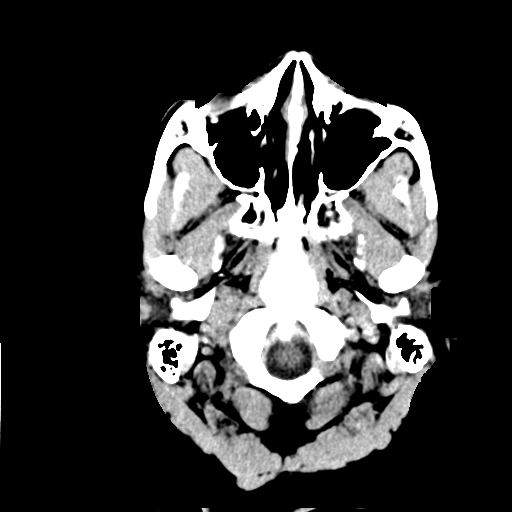
[im 3/30  bone]
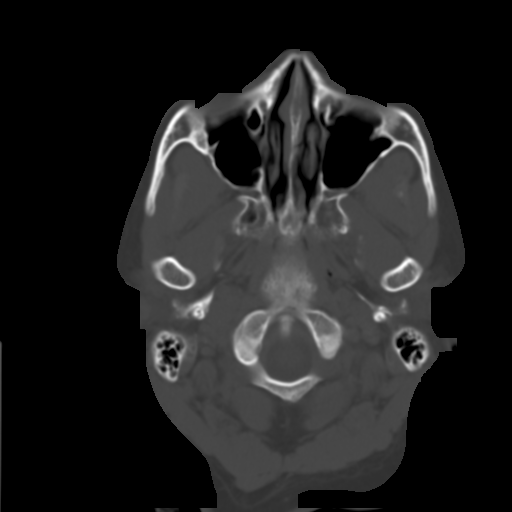
[im 6/30  brain]
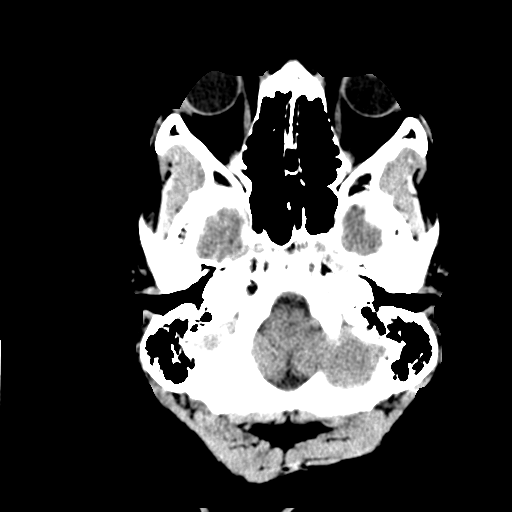
[im 9/30  brain]
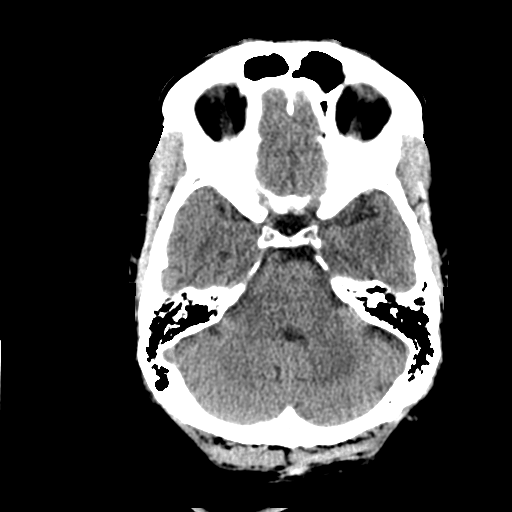
[im 12/30  brain]
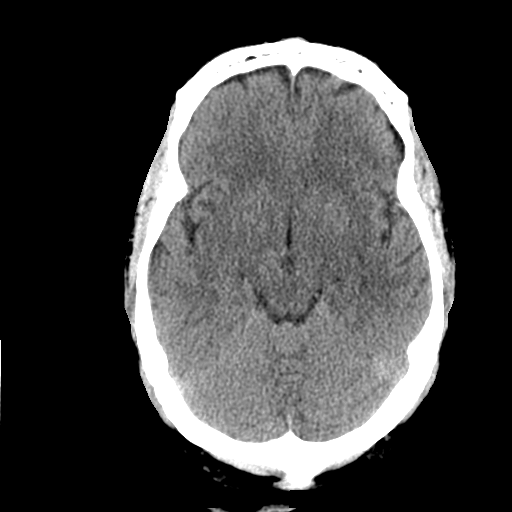
[im 15/30  brain]
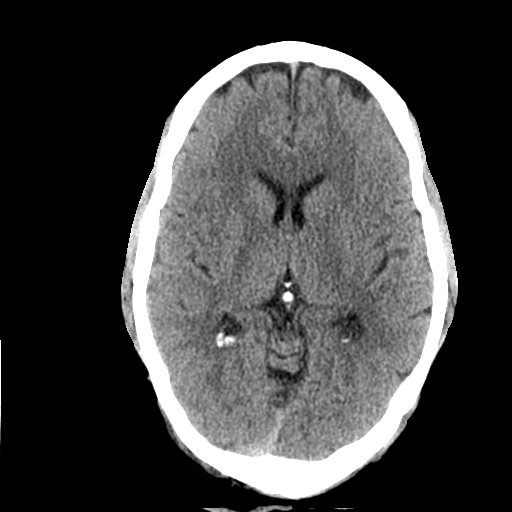
[im 15/30  bone]
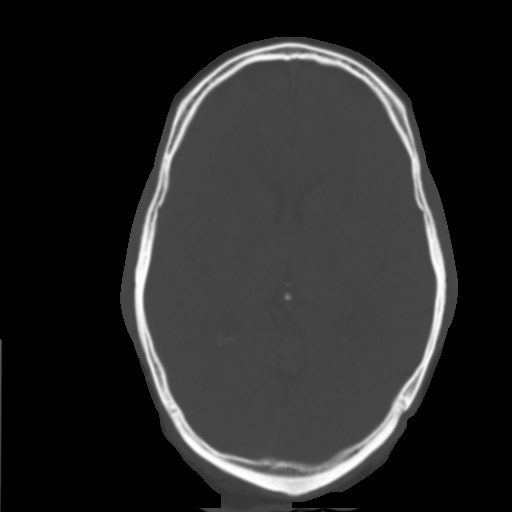
[im 18/30  brain]
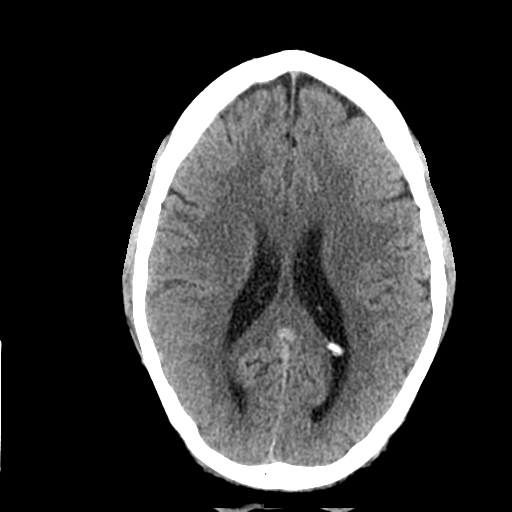
[im 21/30  brain]
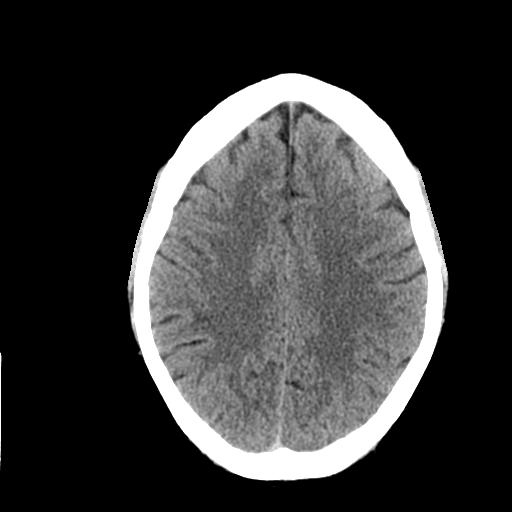
[im 24/30  brain]
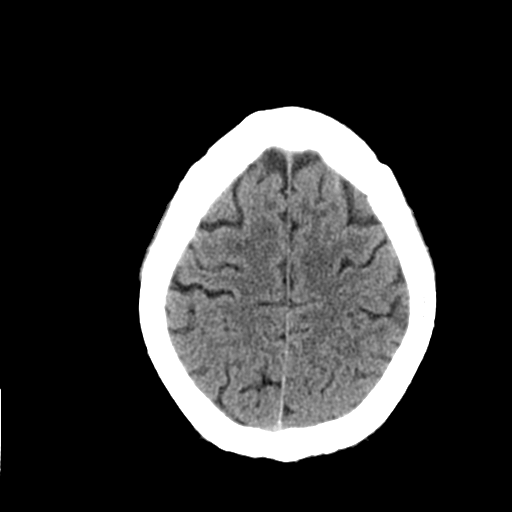
[im 27/30  brain]
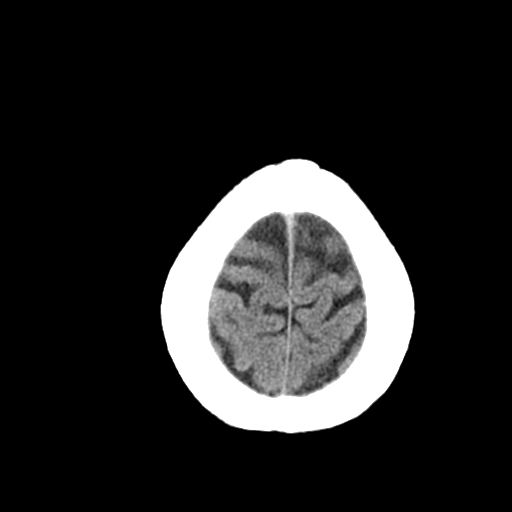
[im 27/30  bone]
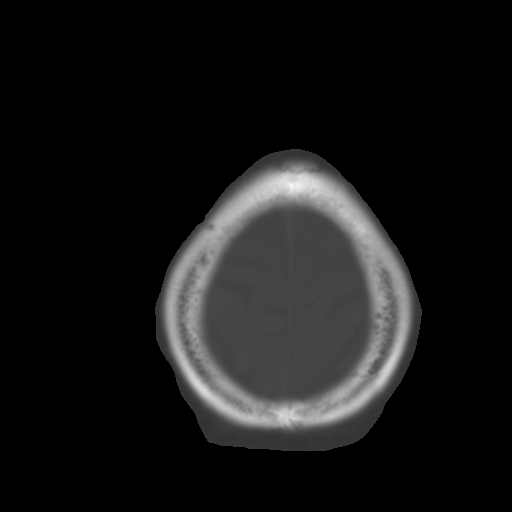

[Series 3: bone window · axial · 0.43mm/px · z∈[+82,+199]mm · 8 of 51 slices shown]
[im 6/51  bone]
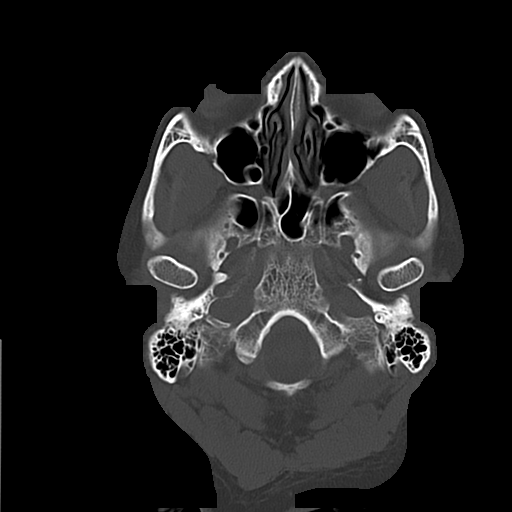
[im 12/51  bone]
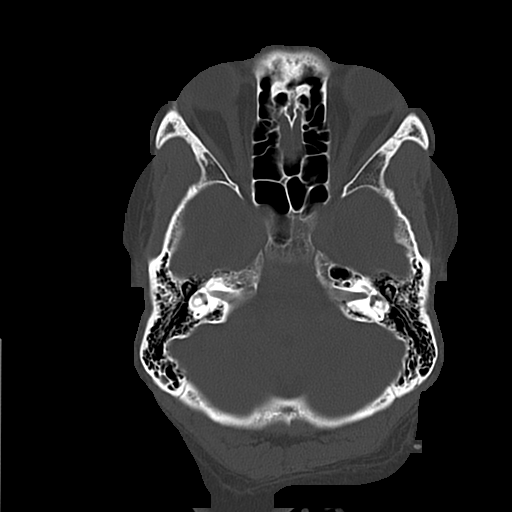
[im 17/51  bone]
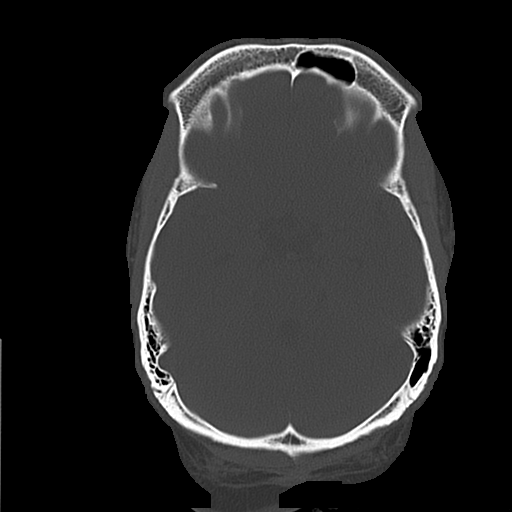
[im 23/51  bone]
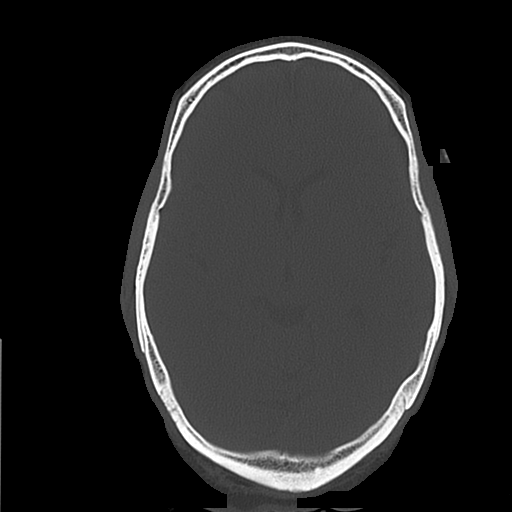
[im 28/51  bone]
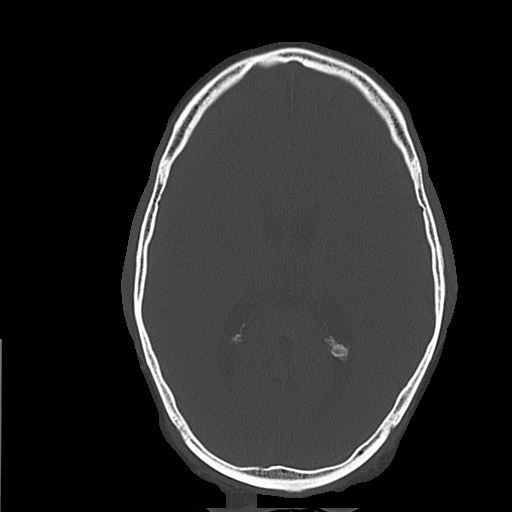
[im 34/51  bone]
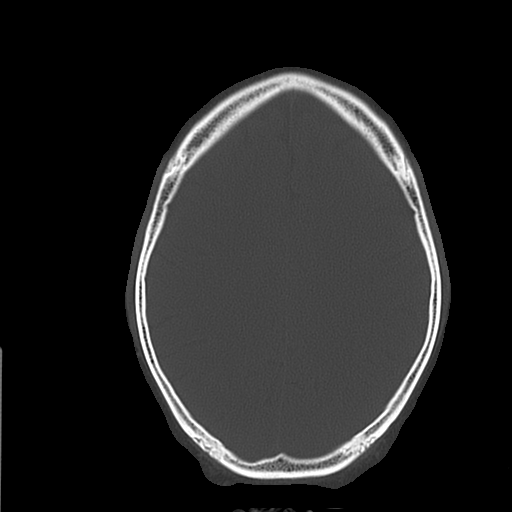
[im 39/51  bone]
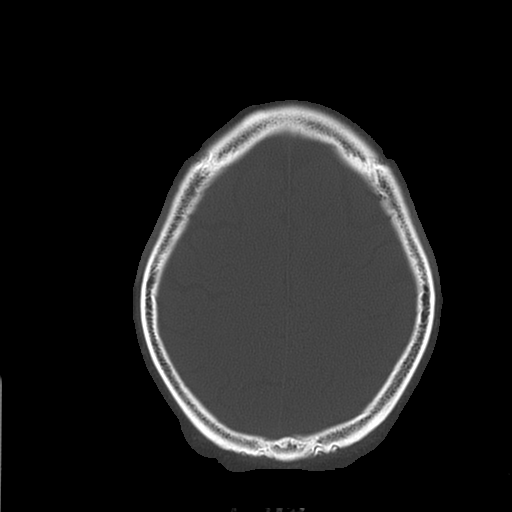
[im 45/51  bone]
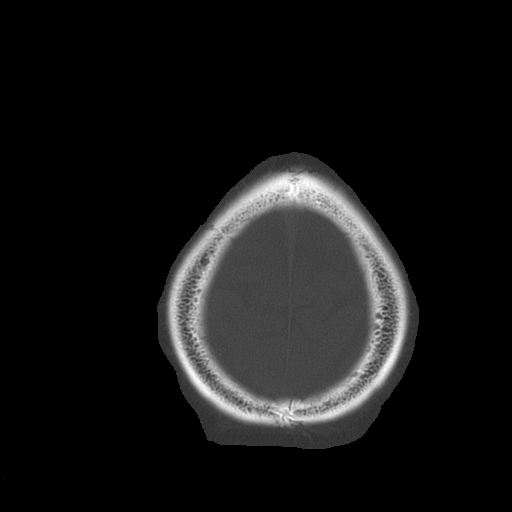

[17 of 30 positions shown; findings below may reference images not displayed]

FINDINGS: Minimal atrophy for age.
Normal ventral morphology.
No midline shift or mass effect. No intracranial hemorrhage, mass
lesion or evidence of acute infarction.
No extra-axial fluid collections.
Visualized paranasal sinuses and mastoid air cells clear.
Bones unremarkable.
IMPRESSION: No acute intracranial abnormalities.

## 2013-01-31 IMAGING — CR DG CHEST 2V
2 series · 2 of 2 positions shown · non-contrast
Comparison: None

CLINICAL DATA: Syncope, hypertension

CHEST - 2 VIEW

[w chest pa]
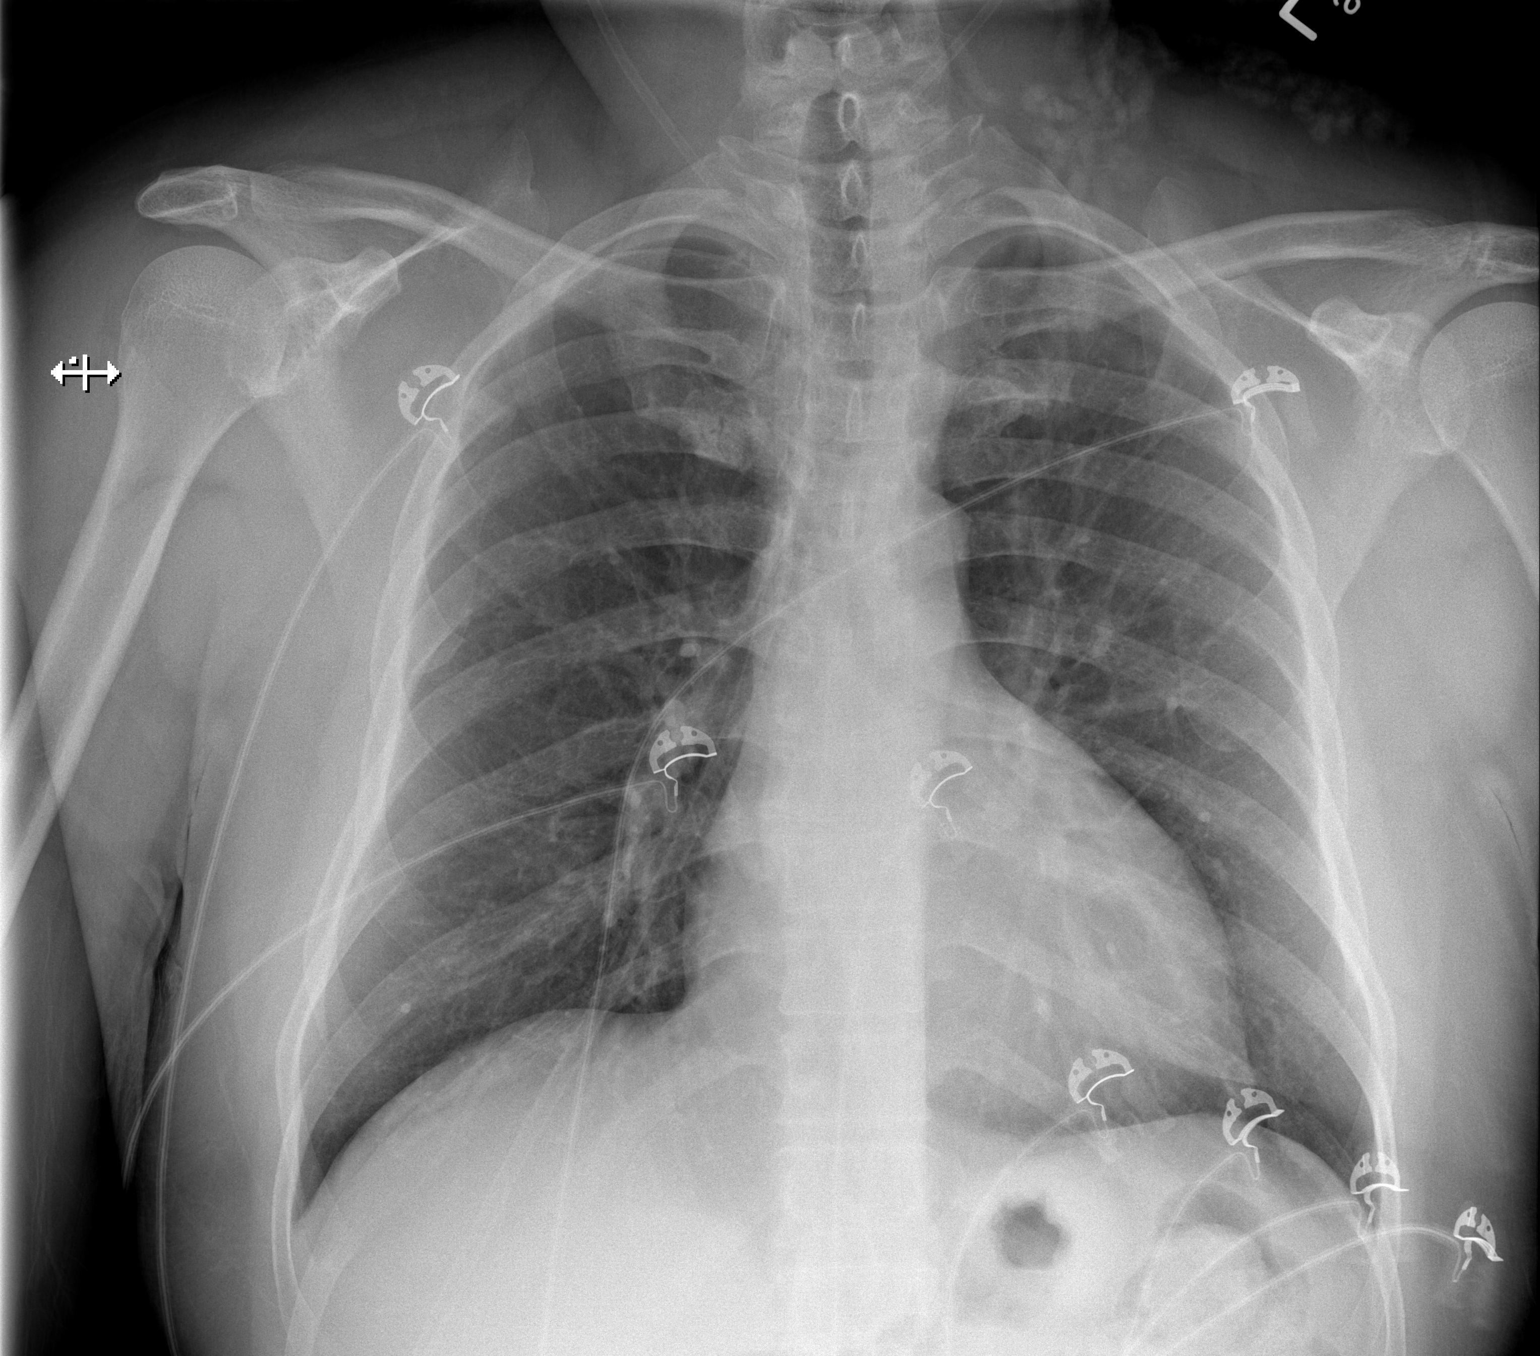

[w chest lat]
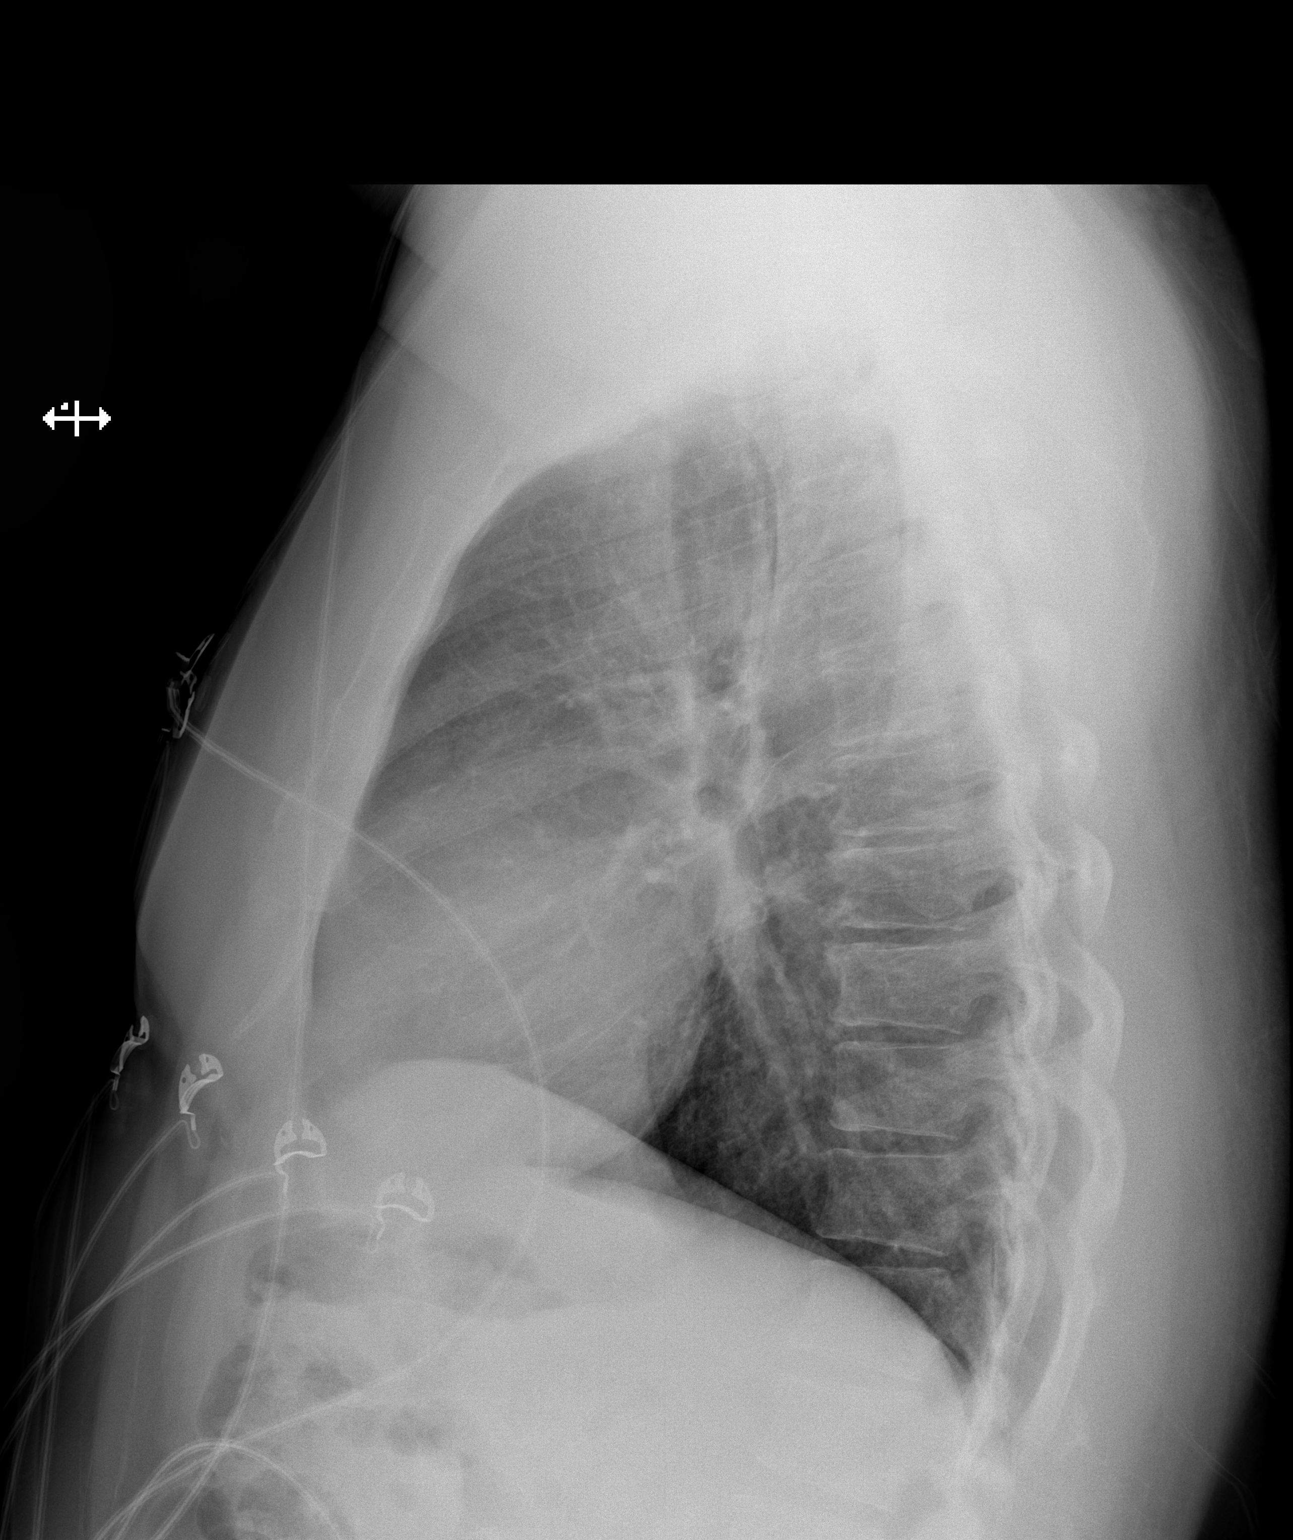

[2 of 2 positions shown; findings below may reference images not displayed]

FINDINGS: Borderline enlargement of cardiac silhouette.
Mediastinal contours and pulmonary vascularity normal.
Lungs clear.
No pleural effusion or pneumothorax.
Bones unremarkable.
IMPRESSION: No acute abnormalities.

## 2013-03-02 ENCOUNTER — Emergency Department (INDEPENDENT_AMBULATORY_CARE_PROVIDER_SITE_OTHER)
Admission: EM | Admit: 2013-03-02 | Discharge: 2013-03-02 | Disposition: A | Discharge status: Home / Self Care | Payer: Self-pay | Source: Home / Self Care | Attending: Emergency Medicine | Admitting: Emergency Medicine

## 2013-03-02 ENCOUNTER — Encounter (HOSPITAL_COMMUNITY): Payer: Self-pay

## 2013-03-02 DIAGNOSIS — I10 Essential (primary) hypertension: Secondary | ICD-10-CM

## 2013-03-02 DIAGNOSIS — K5909 Other constipation: Secondary | ICD-10-CM

## 2013-03-02 MED ORDER — POLYETHYLENE GLYCOL 3350 17 G PO PACK: 17.0000 g | PACK | Freq: Every day | ORAL | Status: DC

## 2013-03-02 MED ORDER — LISINOPRIL-HYDROCHLOROTHIAZIDE 20-25 MG PO TABS: 1.0000 | ORAL_TABLET | Freq: Every day | ORAL | Status: DC

## 2013-03-02 MED ORDER — CLONIDINE HCL 0.2 MG PO TABS: 0.4000 mg | ORAL_TABLET | Freq: Two times a day (BID) | ORAL | Status: DC

## 2013-03-02 NOTE — ED Notes (Signed)
HYQ:MVHQ4<ON> Expected date:03/01/13<BR> Expected time:<BR> Means of arrival:<BR> Comments:<BR> SCABIES

## 2013-03-02 NOTE — ED Provider Notes (Signed)
History     CSN: [id number]  Arrival date & time 03/02/13  1029   First MD Initiated Contact with Patient 03/02/13 1025      Chief Complaint  Patient presents with  . Hypertension    (Consider location/radiation/quality/duration/timing/severity/associated sxs/prior treatment) HPI Cory Rivera is a 42 year old man who comes to the clinic today for evaluation of hypertension. Patient states that his blood pressure is usually well controlled on a combination of clonidine, lisinopril, and hydrochlorothiazide.  Past Medical History  Diagnosis Date  . Hypertension     History reviewed. No pertinent past surgical history.  Family History  Problem Relation Age of Onset  . Cancer Mother     breast  . Cancer Father   . Heart failure Father   . Alcoholism Father     History  Substance Use Topics  . Smoking status: Current Every Day Smoker  . Smokeless tobacco: Not on file     Comment: smokes marijuana daily, no tobacco  . Alcohol Use: Yes      Review of Systems Constitutional: No fever or chills;  Appetite normal; No weight loss.  HEENT: No blurry vision or diplopia, no pharyngitis or dysphagia, occassional dry mouth CV: No chest pain or arrhythmia.  Resp: No SOB, no cough. GI: No N/V, no diarrhea, no melena or hematochezia, +constipation.  GU: No dysuria or hematuria.  MSK: no myalgias/arthralgias.  Neuro:  No headache or focal neurological deficits.  Psych: No depression or anxiety.  Endo: No thyroid disease or DM.  Skin: No rashes or lesions.  Heme: No anemia or blood dyscrasia   Allergies  Review of patient's allergies indicates no known allergies.  Home Medications   Current Outpatient Rx  Name  Route  Sig  Dispense  Refill  . LISINOPRIL PO   Oral   Take by mouth.         . ALPRAZolam (XANAX) 0.5 MG tablet   Oral   Take 0.5 mg by mouth daily as needed. For anxiety         . Aspirin-Acetaminophen-Caffeine (EXCEDRIN PO)   Oral   Take 1-2 tablets by mouth  2 (two) times daily as needed. For pain         . cloNIDine (CATAPRES) 0.3 MG tablet   Oral   Take 0.3 mg by mouth 3 (three) times daily.         Marland Kitchen oxyCODONE-acetaminophen (PERCOCET/ROXICET) 5-325 MG per tablet   Oral   Take 1 tablet by mouth every 4 (four) hours as needed for pain.   15 tablet   0   . sulfamethoxazole-trimethoprim (SEPTRA DS) 800-160 MG per tablet   Oral   Take 1 tablet by mouth every 12 (twelve) hours.   10 tablet   0   . triamterene-hydrochlorothiazide (MAXZIDE-25) 37.5-25 MG per tablet   Oral   Take 1 tablet by mouth every morning.           BP 208/140  Pulse 62  Temp(Src) 97.4 F (36.3 C) (Oral)  SpO2 97%  Physical Exam Gen:  NAD Cardiovascular:  RRR, No M/R/G Respiratory: Lungs CTAB Gastrointestinal: Abdomen soft, NT/ND with normal active bowel sounds. Extremities: No C/E/C Who is a little   ED Course  Procedures (including critical care time)  Labs Reviewed - No data to display No results found.   No diagnosis found.    MDM  1.  HTN: Will continue the patient's usual medications but switch his clonidine to 0.4mg  BID (since the  0.2 mg is on the [facility] $4 list but the 0.3 mg is not), and RF his lisinopril/HCTZ.  His BP is markedly elevated today but he had not yet taken his medications.  Will bring him back in 1 week for a repeat BP check and lab work (lab down today).  Will need chemistries and a FLP.  2.  Constipation: Start MiraLax 17 grams daily.           Maryruth Bun Poppy Mcafee, MD 03/02/13 1128

## 2013-03-02 NOTE — ED Notes (Signed)
Patient has a history of HTN Needs medication refill

## 2013-05-23 ENCOUNTER — Encounter (HOSPITAL_COMMUNITY): Payer: Self-pay | Admitting: Emergency Medicine

## 2013-05-23 ENCOUNTER — Emergency Department (HOSPITAL_COMMUNITY)
Admission: EM | Admit: 2013-05-23 | Discharge: 2013-05-23 | Disposition: A | Discharge status: Home / Self Care | Payer: Self-pay | Attending: Emergency Medicine | Admitting: Emergency Medicine

## 2013-05-23 DIAGNOSIS — Z79899 Other long term (current) drug therapy: Secondary | ICD-10-CM | POA: Insufficient documentation

## 2013-05-23 DIAGNOSIS — F17200 Nicotine dependence, unspecified, uncomplicated: Secondary | ICD-10-CM | POA: Insufficient documentation

## 2013-05-23 DIAGNOSIS — Z7982 Long term (current) use of aspirin: Secondary | ICD-10-CM | POA: Insufficient documentation

## 2013-05-23 DIAGNOSIS — E876 Hypokalemia: Secondary | ICD-10-CM | POA: Insufficient documentation

## 2013-05-23 DIAGNOSIS — I10 Essential (primary) hypertension: Secondary | ICD-10-CM | POA: Insufficient documentation

## 2013-05-23 LAB — POCT I-STAT, CHEM 8
BUN: 18 mg/dL (ref 6–23)
Calcium, Ion: 1.04 mmol/L — ABNORMAL LOW (ref 1.12–1.23)
Chloride: 103 mEq/L (ref 96–112)
HCT: 51 % (ref 39.0–52.0)
Sodium: 140 mEq/L (ref 135–145)
TCO2: 27 mmol/L (ref 0–100)

## 2013-05-23 MED ORDER — HYDROCHLOROTHIAZIDE 25 MG PO TABS: 25.0000 mg | ORAL_TABLET | Freq: Every day | ORAL | Status: DC

## 2013-05-23 MED ORDER — POTASSIUM CHLORIDE CRYS ER 20 MEQ PO TBCR: 40.0000 meq | EXTENDED_RELEASE_TABLET | Freq: Once | ORAL | Status: DC

## 2013-05-23 MED ORDER — VERAPAMIL HCL ER 180 MG PO TBCR: 180.0000 mg | EXTENDED_RELEASE_TABLET | Freq: Every day | ORAL | Status: DC

## 2013-05-23 MED ORDER — POTASSIUM CHLORIDE CRYS ER 20 MEQ PO TBCR: 20.0000 meq | EXTENDED_RELEASE_TABLET | Freq: Every day | ORAL | Status: DC

## 2013-05-23 MED ORDER — CLONIDINE HCL 0.2 MG PO TABS: 0.2000 mg | ORAL_TABLET | Freq: Three times a day (TID) | ORAL | Status: DC

## 2013-05-23 NOTE — ED Provider Notes (Signed)
Medical screening examination/treatment/procedure(s) were conducted as a shared visit with non-physician practitioner(s) and myself.  I personally evaluated the patient during the encounter  Doug Sou, MD 05/23/13 [phone]

## 2013-05-23 NOTE — ED Provider Notes (Signed)
Patient presents with elevated blood pressure. He denies noncompliance with medication however ran out of her clonidine this morning. He is asymptomatic  Doug Sou, MD 05/23/13 1243

## 2013-05-23 NOTE — ED Provider Notes (Signed)
History     CSN: [id number]  Arrival date & time 05/23/13  4098   First MD Initiated Contact with Patient 05/23/13 1000      Chief Complaint  Patient presents with  . Hypertension    (Consider location/radiation/quality/duration/timing/severity/associated sxs/prior treatment) HPI  Cory Rivera is a 42 y.o. male complaining of elevated blood pressure and difficulty affording his vision has a blood pressure cuff which he uses to check his pressures at home. Diagnosed with hypertension patient is compliant with his clonidine and lisinopril/hctz. He ran out of his clonidine this a.m., Has not missed a dose Patient denies chest pain, shortness of breath, dyspnea on exertion, paroxysmal nocturnal dyspnea, change in vision, headache, focal weakness, dysarthria, ataxia, abdominal pain, change in bowel or bladder habits.   Past Medical History  Diagnosis Date  . Hypertension     History reviewed. No pertinent past surgical history.  Family History  Problem Relation Age of Onset  . Cancer Mother     breast  . Cancer Father   . Heart failure Father   . Alcoholism Father     History  Substance Use Topics  . Smoking status: Current Every Day Smoker  . Smokeless tobacco: Not on file     Comment: smokes marijuana daily, no tobacco  . Alcohol Use: Yes      Review of Systems  Constitutional: Negative for fever.  Respiratory: Negative for shortness of breath.   Cardiovascular: Negative for chest pain.  Gastrointestinal: Negative for nausea, vomiting, abdominal pain and diarrhea.  All other systems reviewed and are negative.    Allergies  Review of patient's allergies indicates no known allergies.  Home Medications   Current Outpatient Rx  Name  Route  Sig  Dispense  Refill  . Aspirin-Acetaminophen-Caffeine (EXCEDRIN PO)   Oral   Take 1-2 tablets by mouth 2 (two) times daily as needed. For pain         . cloNIDine (CATAPRES) 0.2 MG tablet   Oral   Take 2 tablets  (0.4 mg total) by mouth 2 (two) times daily.   90 tablet   3   . lisinopril-hydrochlorothiazide (PRINZIDE,ZESTORETIC) 20-25 MG per tablet   Oral   Take 1 tablet by mouth daily.   90 tablet   3   . polyethylene glycol (MIRALAX) packet   Oral   Take 17 g by mouth daily.   30 each   11     BP 221/140  Pulse 78  Temp(Src) 97.8 F (36.6 C) (Oral)  Resp 18  SpO2 100%  Physical Exam  Nursing note and vitals reviewed. Constitutional: He is oriented to person, place, and time. He appears well-developed and well-nourished. No distress.  HENT:  Head: Normocephalic.  Mouth/Throat: Oropharynx is clear and moist.  Eyes: Conjunctivae and EOM are normal. Pupils are equal, round, and reactive to light.  Neck: Normal range of motion. No JVD present.  Cardiovascular: Normal rate, regular rhythm and intact distal pulses.   Pulmonary/Chest: Effort normal and breath sounds normal. No stridor. No respiratory distress. He has no wheezes. He has no rales. He exhibits no tenderness.  Abdominal: Soft. Bowel sounds are normal. He exhibits no distension and no mass. There is no tenderness. There is no rebound and no guarding.  Musculoskeletal: Normal range of motion. He exhibits no edema and no tenderness.  Neurological: He is alert and oriented to person, place, and time.  Follows commands, Goal oriented speech, Strength is 5 out of 5x4 extremities, patient ambulates  with a coordinated in nonantalgic gait. Sensation is grossly intact.   Psychiatric: He has a normal mood and affect.    ED Course  Procedures (including critical care time)  Labs Reviewed - No data to display No results found.   1. Hypertension   2. Tobacco use disorder       MDM   Filed Vitals:   05/23/13 0945 05/23/13 1020 05/23/13 1105  BP: 221/140 210/125 215/132  Pulse: 78 64 64  Temp: 97.8 F (36.6 C)    TempSrc: Oral    Resp: 18 16   SpO2: 100% 98% 100%     Cory Rivera is a 42 y.o. male  with  asymptomatic hypertension. Patient is compliant with his medications but he has run out of his clonidine this a.m. I am changing his lisinopril, hydrochlorothiazide to verapamil based on [facility] recommendations for African Americans.  I-STAT Chem-8 shows a mild hypokalemia at 3.0 his renal function is normal. I will replete him orally with 40 mEq by mouth. I've asked the patient to follow for a blood pressure check next week at the urgent care Center. I have encouraged him to follow with the [facility] for further management of his very difficult to control hypertension.  The patient is hemodynamically stable, appropriate for, and amenable to, discharge at this time. Pt verbalized understanding and agrees with care plan. Outpatient follow-up and return precautions given.    New Prescriptions   CLONIDINE (CATAPRES) 0.2 MG TABLET    Take 1 tablet (0.2 mg total) by mouth 3 (three) times daily.   HYDROCHLOROTHIAZIDE (HYDRODIURIL) 25 MG TABLET    Take 1 tablet (25 mg total) by mouth daily.   VERAPAMIL (CALAN-SR) 180 MG CR TABLET    Take 1 tablet (180 mg total) by mouth at bedtime.           Wynetta Emery, PA-C 05/23/13 1704

## 2013-05-23 NOTE — ED Notes (Signed)
Pt c/o high BP. Pt does not have insurance and cannot afford his BP medications. Pt denies CP, dizziness or headaches.

## 2013-05-23 NOTE — Progress Notes (Signed)
NCM spoke to pt and states he is currently not working. He is having difficulty with paying for his blood pressure meds. States he is looking for a job currently. Provided pt with application for [facility] that offers Clonidine 180 days for $20 if he qualifies. Karin Golden has Clonidine on discount list $4.00 and Lisinopril/HCTZ $8 at [employer]. NCM encourage pt to price shop for best prices. MATCH not used due to inexpensive cost of drugs. Explained to pt he can go to [facility] for follow up care. Explained the importance of establishing a PCP. Also to speak with MD about diet and exercise to help lower his blood pressure. Pt states he has been under some stress recently and searched on internet on forms of meditation practices to help with stress. Provided pt with contact number for community clinics on dc instructions. Isidoro Donning RN CCM Case Mgmt phone [phone]

## 2013-07-02 ENCOUNTER — Encounter (HOSPITAL_COMMUNITY): Payer: Self-pay | Admitting: Emergency Medicine

## 2013-07-02 ENCOUNTER — Emergency Department (HOSPITAL_COMMUNITY)
Admission: EM | Admit: 2013-07-02 | Discharge: 2013-07-02 | Disposition: A | Discharge status: Home / Self Care | Payer: Self-pay | Attending: Emergency Medicine | Admitting: Emergency Medicine

## 2013-07-02 DIAGNOSIS — Z79899 Other long term (current) drug therapy: Secondary | ICD-10-CM | POA: Insufficient documentation

## 2013-07-02 DIAGNOSIS — I10 Essential (primary) hypertension: Secondary | ICD-10-CM | POA: Insufficient documentation

## 2013-07-02 DIAGNOSIS — F17200 Nicotine dependence, unspecified, uncomplicated: Secondary | ICD-10-CM | POA: Insufficient documentation

## 2013-07-02 DIAGNOSIS — Z760 Encounter for issue of repeat prescription: Secondary | ICD-10-CM | POA: Insufficient documentation

## 2013-07-02 MED ORDER — CLONIDINE HCL 0.2 MG PO TABS: 0.2000 mg | ORAL_TABLET | Freq: Three times a day (TID) | ORAL | Status: DC

## 2013-07-02 MED ORDER — HYDROCHLOROTHIAZIDE 12.5 MG PO TABS: 25.0000 mg | ORAL_TABLET | Freq: Every day | ORAL | Status: DC

## 2013-07-02 NOTE — ED Provider Notes (Signed)
History    CSN: [id number] Arrival date & time 07/02/13  5284  First MD Initiated Contact with Patient 07/02/13 [phone]     Chief Complaint  Patient presents with  . Medication Refill   (Consider location/radiation/quality/duration/timing/severity/associated sxs/prior Treatment) HPI Pt is a 42yo male with HTN presenting today requesting BP medication, clonidine and HCTZ. Pt states that he has follow up appoint with his regular doctor in 1-2 weeks however ran out of his clonidine this morning and has 2-3 days of HCTZ left.  Pt has not other complaints. Denies weakness, fatigue, headache, dizziness, change in vision, n/v/d, fever.    Past Medical History  Diagnosis Date  . Hypertension    History reviewed. No pertinent past surgical history. Family History  Problem Relation Age of Onset  . Cancer Mother     breast  . Cancer Father   . Heart failure Father   . Alcoholism Father    History  Substance Use Topics  . Smoking status: Current Every Day Smoker  . Smokeless tobacco: Not on file     Comment: smokes marijuana daily, no tobacco  . Alcohol Use: Yes    Review of Systems  Constitutional: Negative for fever and chills.  Gastrointestinal: Negative for nausea.  Neurological: Negative for dizziness, syncope and headaches.  All other systems reviewed and are negative.    Allergies  Review of patient's allergies indicates no known allergies.  Home Medications   Current Outpatient Rx  Name  Route  Sig  Dispense  Refill  . Aspirin-Acetaminophen-Caffeine (EXCEDRIN PO)   Oral   Take 1-2 tablets by mouth 2 (two) times daily as needed. For pain         . cloNIDine (CATAPRES) 0.2 MG tablet   Oral   Take 1 tablet (0.2 mg total) by mouth 3 (three) times daily.   90 tablet   0   . cloNIDine (CATAPRES) 0.2 MG tablet   Oral   Take 1 tablet (0.2 mg total) by mouth 3 (three) times daily. Take one and a half tablets 3 times a day   30 tablet   0   . diphenhydrAMINE  (BENADRYL) 25 mg capsule   Oral   Take 25 mg by mouth daily as needed for itching or allergies.         . hydrochlorothiazide (HYDRODIURIL) 12.5 MG tablet   Oral   Take 2 tablets (25 mg total) by mouth daily.   30 tablet   0   . hydrochlorothiazide (HYDRODIURIL) 25 MG tablet   Oral   Take 1 tablet (25 mg total) by mouth daily.   30 tablet   0   . polyethylene glycol (MIRALAX) packet   Oral   Take 17 g by mouth daily.   30 each   11   . potassium chloride SA (K-DUR,KLOR-CON) 20 MEQ tablet   Oral   Take 1 tablet (20 mEq total) by mouth daily.   3 tablet   0   . verapamil (CALAN-SR) 180 MG CR tablet   Oral   Take 1 tablet (180 mg total) by mouth at bedtime.   30 tablet   0    BP 173/111  Pulse 80  Temp(Src) 98.2 F (36.8 C) (Oral)  Resp 16  SpO2 99% Physical Exam  Nursing note and vitals reviewed. Constitutional: He appears well-developed and well-nourished.  HENT:  Head: Normocephalic and atraumatic.  Eyes: Conjunctivae are normal. No scleral icterus.  Neck: Normal range of motion. Neck supple.  Cardiovascular: Normal rate, regular rhythm and normal heart sounds.   Pulmonary/Chest: Effort normal and breath sounds normal. No respiratory distress. He has no wheezes. He has no rales. He exhibits no tenderness.  Abdominal: Soft. Bowel sounds are normal. He exhibits no distension and no mass. There is no tenderness. There is no rebound and no guarding.  Musculoskeletal: Normal range of motion.  Neurological: He is alert.  Skin: Skin is warm and dry.    ED Course  Procedures (including critical care time) Labs Reviewed - No data to display No results found. 1. Medication refill   2. HTN (hypertension)     MDM  Pt requesting med refill on BP meds. Has f/u appointment with his PCP in 1-2 weeks.  Pt is otherwise well.  No other concerns or complaints.  Rx: clonidine and hydrochlorothiazide   Junius Finner, PA-C 07/02/13 [phone]

## 2013-07-02 NOTE — Progress Notes (Signed)
[facility] seen patient. Pt stated that he had turn in paperwork for Medicaid, but had not heard back from them yet. Provided pt with a list of primary care resources.

## 2013-07-02 NOTE — ED Notes (Signed)
Pt reports that he took last blood pressure tablet this am.  Pt states he has MD appointment in 2 days.  No complaints today, just needs rx refill.

## 2013-07-02 NOTE — ED Provider Notes (Signed)
Medical screening examination/treatment/procedure(s) were performed by non-physician practitioner and as supervising physician I was immediately available for consultation/collaboration.   Dorthy Magnussen Y. Mykle Pascua, MD 07/02/13 0935 

## 2013-07-06 ENCOUNTER — Telehealth: Payer: Self-pay | Admitting: Family Medicine

## 2013-07-06 NOTE — Telephone Encounter (Signed)
Medication refill

## 2013-07-06 NOTE — Telephone Encounter (Signed)
Pt is out of clonidine (.2mg ) and needs a refill; pt will be out of medication tomorrow morning and uses the [facility] located off of [address] to get scripts.

## 2013-07-07 ENCOUNTER — Emergency Department (HOSPITAL_COMMUNITY)
Admission: EM | Admit: 2013-07-07 | Discharge: 2013-07-07 | Disposition: A | Discharge status: Home / Self Care | Payer: Self-pay | Attending: Emergency Medicine | Admitting: Emergency Medicine

## 2013-07-07 ENCOUNTER — Encounter (HOSPITAL_COMMUNITY): Payer: Self-pay | Admitting: Emergency Medicine

## 2013-07-07 DIAGNOSIS — Z79899 Other long term (current) drug therapy: Secondary | ICD-10-CM | POA: Insufficient documentation

## 2013-07-07 DIAGNOSIS — Z87891 Personal history of nicotine dependence: Secondary | ICD-10-CM | POA: Insufficient documentation

## 2013-07-07 DIAGNOSIS — Z760 Encounter for issue of repeat prescription: Secondary | ICD-10-CM | POA: Insufficient documentation

## 2013-07-07 DIAGNOSIS — I10 Essential (primary) hypertension: Secondary | ICD-10-CM | POA: Insufficient documentation

## 2013-07-07 MED ORDER — CLONIDINE HCL 0.2 MG PO TABS: ORAL_TABLET | ORAL | Status: DC

## 2013-07-07 NOTE — ED Provider Notes (Signed)
Medical screening examination/treatment/procedure(s) were performed by non-physician practitioner and as supervising physician I was immediately available for consultation/collaboration.   Loren Racer, MD 07/07/13 1530

## 2013-07-07 NOTE — ED Notes (Signed)
Needs rx for Clonidine. States went for his appointment at [facility] and was told "the doctor isn't here". Pt had his med this am but has no more.

## 2013-07-07 NOTE — ED Provider Notes (Signed)
History    CSN: [id number] Arrival date & time 07/07/13  0915  First MD Initiated Contact with Patient 07/07/13 [phone]     No chief complaint on file.  (Consider location/radiation/quality/duration/timing/severity/associated sxs/prior Treatment) HPI Cory Rivera Is a(n)42 y.o. Who presents for medication refill.  Patient has a history of hypertension.  He states that [facility] because of volume of patients.  Patient takes clonidine daily.  He states that he took his last dose this morning. Denies unilateral weakness, facial asymmetry, difficulty with speech, change in gait, headache changes in vision or vertigo.   Past Medical History  Diagnosis Date  . Hypertension    History reviewed. No pertinent past surgical history. Family History  Problem Relation Age of Onset  . Cancer Mother     breast  . Cancer Father   . Heart failure Father   . Alcoholism Father    History  Substance Use Topics  . Smoking status: Former Games developer  . Smokeless tobacco: Not on file     Comment: smokes marijuana daily, no tobacco  . Alcohol Use: Yes    Review of Systems  Constitutional: Negative for fever and chills.  Eyes: Negative for visual disturbance.  Respiratory: Negative for shortness of breath.   Cardiovascular: Negative for chest pain.  Musculoskeletal: Negative for myalgias and arthralgias.  Neurological: Negative for weakness and headaches.    Allergies  Review of patient's allergies indicates no known allergies.  Home Medications   Current Outpatient Rx  Name  Route  Sig  Dispense  Refill  . aspirin-acetaminophen-caffeine (EXCEDRIN MIGRAINE) 250-250-65 MG per tablet   Oral   Take 1 tablet by mouth every 6 (six) hours as needed for pain (or headache).         . cloNIDine (CATAPRES) 0.2 MG tablet   Oral   Take 0.3 mg by mouth 3 (three) times daily. Takes 1 and a half tablets          . diphenhydrAMINE (BENADRYL) 25 mg capsule   Oral   Take 25 mg by mouth daily as needed for itching or allergies.         Marland Kitchen lisinopril-hydrochlorothiazide (PRINZIDE,ZESTORETIC) 20-25 MG per tablet   Oral   Take 1 tablet by mouth 2 (two) times daily.          BP 159/104  Pulse 73  Temp(Src) 98.1 F (36.7 C) (Oral)  Resp 20  Ht 5\' 6"  (1.676 m)  Wt 190 lb (86.183 kg)  BMI 30.68 kg/m2  SpO2 96% Physical Exam  Nursing note and vitals reviewed. Constitutional: He appears well-developed and well-nourished. No distress.  HENT:  Head: Normocephalic and atraumatic.  Eyes: Conjunctivae are normal. No scleral icterus.  Neck: Normal range of motion. Neck supple.  Cardiovascular: Normal rate, regular rhythm and normal heart sounds.   Pulmonary/Chest: Effort normal and breath sounds normal. No respiratory distress.  Abdominal: Soft. There is no tenderness.  Musculoskeletal: He exhibits no edema.  Neurological: He is alert.  Skin: Skin is warm and dry. He is not diaphoretic.  Psychiatric: His behavior is normal.    ED Course  Procedures (including critical care time) Labs Reviewed - No data to display No results found. 1. Encounter for medication refill   2. Hypertension     MDM  10:10 AM BP 159/104  Pulse 73  Temp(Src) 98.1 F (36.7 C) (Oral)  Resp 20  Ht 5\' 6"  (1.676  m)  Wt 190 lb (86.183 kg)  BMI 30.68 kg/m2  SpO2 96% Patient with HTN.  Meds refilled for 1 month. [facility] The patient appears reasonably screened and/or stabilized for discharge and I doubt any other medical condition or other [facility] requiring further screening, evaluation, or treatment in the ED at this time prior to discharge.   Arthor Captain, PA-C 07/07/13 1011

## 2013-07-12 ENCOUNTER — Ambulatory Visit: Payer: PRIVATE HEALTH INSURANCE

## 2013-07-30 ENCOUNTER — Ambulatory Visit: Payer: PRIVATE HEALTH INSURANCE

## 2013-08-29 ENCOUNTER — Emergency Department (HOSPITAL_COMMUNITY)
Admission: EM | Admit: 2013-08-29 | Discharge: 2013-08-29 | Disposition: A | Discharge status: Home / Self Care | Payer: Self-pay | Attending: Emergency Medicine | Admitting: Emergency Medicine

## 2013-08-29 ENCOUNTER — Encounter (HOSPITAL_COMMUNITY): Payer: Self-pay | Admitting: Emergency Medicine

## 2013-08-29 DIAGNOSIS — Z87891 Personal history of nicotine dependence: Secondary | ICD-10-CM | POA: Insufficient documentation

## 2013-08-29 DIAGNOSIS — Z79899 Other long term (current) drug therapy: Secondary | ICD-10-CM | POA: Insufficient documentation

## 2013-08-29 DIAGNOSIS — Z760 Encounter for issue of repeat prescription: Secondary | ICD-10-CM | POA: Insufficient documentation

## 2013-08-29 DIAGNOSIS — I10 Essential (primary) hypertension: Secondary | ICD-10-CM | POA: Insufficient documentation

## 2013-08-29 MED ORDER — CLONIDINE HCL 0.2 MG PO TABS: 0.3000 mg | ORAL_TABLET | Freq: Three times a day (TID) | ORAL | Status: DC

## 2013-08-29 NOTE — ED Provider Notes (Signed)
CSN: [id number]     Arrival date & time 08/29/13  1308 History   First MD Initiated Contact with Patient 08/29/13 1002     Chief Complaint  Patient presents with  . Medication Refill   (Consider location/radiation/quality/duration/timing/severity/associated sxs/prior Treatment) The history is provided by the patient.   patient presents with the request for medication refill. He states he's been able to get into see the clinic this last month. He states he will only give one month worth of medication. He states he  took the last dose of his clonidine today. No chest. No numbness weakness. No trouble breathing. No headache. Past Medical History  Diagnosis Date  . Hypertension    History reviewed. No pertinent past surgical history. Family History  Problem Relation Age of Onset  . Cancer Mother     breast  . Cancer Father   . Heart failure Father   . Alcoholism Father    History  Substance Use Topics  . Smoking status: Former Games developer  . Smokeless tobacco: Not on file     Comment: smokes marijuana daily, no tobacco  . Alcohol Use: Yes    Review of Systems  Constitutional: Negative for fever.  Respiratory: Negative for shortness of breath.   Cardiovascular: Negative for chest pain.  Gastrointestinal: Negative for abdominal pain.  Psychiatric/Behavioral: Negative for confusion.    Allergies  Review of patient's allergies indicates no known allergies.  Home Medications   Current Outpatient Rx  Name  Route  Sig  Dispense  Refill  . cloNIDine (CATAPRES) 0.2 MG tablet   Oral   Take 0.3 mg by mouth 3 (three) times daily. Takes 1 and a half tablets         . lisinopril-hydrochlorothiazide (PRINZIDE,ZESTORETIC) 20-25 MG per tablet   Oral   Take 1 tablet by mouth 2 (two) times daily.         . cloNIDine (CATAPRES) 0.2 MG tablet   Oral   Take 1.5 tablets (0.3 mg total) by mouth 3 (three) times daily.   135 tablet   0    BP 151/95  Pulse 74  Temp(Src) 97.7 F (36.5  C) (Oral)  Resp 18  SpO2 100% Physical Exam  Constitutional: He appears well-developed and well-nourished.  HENT:  Head: Normocephalic.  Cardiovascular: Normal rate and regular rhythm.   Pulmonary/Chest:  Mildly harsh breath sounds  Abdominal: There is no tenderness.  Musculoskeletal: He exhibits no edema.  Neurological: He is alert.    ED Course  Procedures (including critical care time) Labs Review Labs Reviewed - No data to display Imaging Review No results found.  MDM   1. Medication refill   2. Hypertension     patient here for medication refill. He states he does not like the clinic he goes to. He was given [facility] He states he went there before but the wait was too long.    Juliet Rude. Rubin Payor, MD 08/29/13 1031

## 2013-08-29 NOTE — ED Notes (Signed)
Pt requesting refill for BP meds. Pt states he takes Clonidine at home. Pt has no other c/o . Pt A&O and in NAD

## 2013-08-29 NOTE — ED Notes (Signed)
Pt ran out of his medication for bp clonidine and needs refill denies any c/o

## 2014-01-07 ENCOUNTER — Encounter (HOSPITAL_COMMUNITY): Payer: Self-pay | Admitting: Emergency Medicine

## 2014-01-07 ENCOUNTER — Emergency Department (HOSPITAL_COMMUNITY)
Admission: EM | Admit: 2014-01-07 | Discharge: 2014-01-07 | Disposition: A | Discharge status: Home / Self Care | Payer: Self-pay | Attending: Emergency Medicine | Admitting: Emergency Medicine

## 2014-01-07 DIAGNOSIS — Z87891 Personal history of nicotine dependence: Secondary | ICD-10-CM | POA: Insufficient documentation

## 2014-01-07 DIAGNOSIS — I10 Essential (primary) hypertension: Secondary | ICD-10-CM | POA: Insufficient documentation

## 2014-01-07 DIAGNOSIS — Z79899 Other long term (current) drug therapy: Secondary | ICD-10-CM | POA: Insufficient documentation

## 2014-01-07 DIAGNOSIS — Z760 Encounter for issue of repeat prescription: Secondary | ICD-10-CM | POA: Insufficient documentation

## 2014-01-07 MED ORDER — CLONIDINE HCL 0.2 MG PO TABS: 0.3000 mg | ORAL_TABLET | Freq: Three times a day (TID) | ORAL | Status: DC

## 2014-01-07 MED ORDER — LISINOPRIL-HYDROCHLOROTHIAZIDE 20-25 MG PO TABS
1.0000 | ORAL_TABLET | Freq: Two times a day (BID) | ORAL | Status: DC
Start: 2014-01-07 — End: 2015-06-15

## 2014-01-07 MED ORDER — HYDRALAZINE HCL 50 MG PO TABS: 50.0000 mg | ORAL_TABLET | Freq: Two times a day (BID) | ORAL | Status: DC

## 2014-01-07 MED ORDER — AMLODIPINE BESYLATE 10 MG PO TABS: 10.0000 mg | ORAL_TABLET | Freq: Every day | ORAL | Status: DC

## 2014-01-07 NOTE — ED Provider Notes (Signed)
Medical screening examination/treatment/procedure(s) were performed by non-physician practitioner and as supervising physician I was immediately available for consultation/collaboration.  EKG Interpretation   None         Audree CamelScott T Eliyahu Bille, MD 01/07/14 2251

## 2014-01-07 NOTE — ED Provider Notes (Signed)
CSN: [id number]     Arrival date & time 01/07/14  1046 History   First MD Initiated Contact with Patient 01/07/14 1117     Chief Complaint  Patient presents with  . Medication Refill   (Consider location/radiation/quality/duration/timing/severity/associated sxs/prior Treatment) The history is provided by the patient.   Patient with history of hypertension usually gets his medications refilled free clinic that is held every other Saturday. States that he missed the opportunity to go last Saturday and he is now running out of his medications. He states he has been taking them as prescribed did run out of one of them to 3 days ago. Denies any chest pain, shortness of breath, headache, confusion.   Past Medical History  Diagnosis Date  . Hypertension    History reviewed. No pertinent past surgical history. Family History  Problem Relation Age of Onset  . Cancer Mother     breast  . Cancer Father   . Heart failure Father   . Alcoholism Father    History  Substance Use Topics  . Smoking status: Former Games developer  . Smokeless tobacco: Not on file     Comment: smokes marijuana daily, no tobacco  . Alcohol Use: Yes    Review of Systems  Eyes: Negative for visual disturbance.  Respiratory: Negative for shortness of breath.   Cardiovascular: Negative for chest pain.  Gastrointestinal: Negative for abdominal pain.  Neurological: Negative for headaches.  Psychiatric/Behavioral: Negative for confusion.    Allergies  Review of patient's allergies indicates no known allergies.  Home Medications   Current Outpatient Rx  Name  Route  Sig  Dispense  Refill  . cetirizine (ZYRTEC) 10 MG tablet   Oral   Take 10 mg by mouth daily.         Marland Kitchen amLODipine (NORVASC) 10 MG tablet   Oral   Take 1 tablet (10 mg total) by mouth daily.   30 tablet   0   . cloNIDine (CATAPRES) 0.2 MG tablet   Oral   Take 1.5 tablets (0.3 mg total) by mouth 3 (three) times daily.   135 tablet   0   .  hydrALAZINE (APRESOLINE) 50 MG tablet   Oral   Take 1 tablet (50 mg total) by mouth 2 (two) times daily.   60 tablet   0   . lisinopril-hydrochlorothiazide (PRINZIDE,ZESTORETIC) 20-25 MG per tablet   Oral   Take 1 tablet by mouth 2 (two) times daily.   60 tablet   0    BP 140/86  Pulse 111  Temp(Src) 97.8 F (36.6 C) (Oral)  Resp 18  Wt 221 lb 7 oz (100.443 kg)  SpO2 97% Physical Exam  Nursing note and vitals reviewed. Constitutional: He appears well-developed and well-nourished. No distress.  HENT:  Head: Normocephalic and atraumatic.  Neck: Neck supple.  Cardiovascular: Normal rate and regular rhythm.   Pulmonary/Chest: Effort normal and breath sounds normal. No respiratory distress. He has no wheezes. He has no rales.  Abdominal: Soft. He exhibits no distension and no mass. There is no tenderness. There is no rebound and no guarding.  Neurological: He is alert. He exhibits normal muscle tone.  Skin: He is not diaphoretic.    ED Course  Procedures (including critical care time) Labs Review Labs Reviewed - No data to display Imaging Review No results found.  EKG Interpretation   None      Filed Vitals:   01/07/14 1059  BP: 140/86  Pulse: 111  Temp: 97.8  F (36.6 C)  Resp: 18     MDM   1. Medication refill   2. Hypertension    Patient with history of hypertension here for a medication refill. He is having no symptoms at this time. He is mildly hypertensive here and tachycardic. Patient discharged home with refills. Patient to followup with primary care provider.  Discussed findings, treatment, and follow up  with patient.  Pt given return precautions.  Pt verbalizes understanding and agrees with plan.         Trixie Dredgemily Caresse Sedivy, PA-C 01/07/14 1220

## 2014-01-07 NOTE — Discharge Instructions (Signed)
Read the information below.  Use the prescribed medication as directed.  Please discuss all new medications with your pharmacist.  You may return to the Emergency Department at any time for worsening condition or any new symptoms that concern you.  If you develop chest pain, shortness of breath, severe headache, or confusion, return to the ER immediately for a recheck.    Medication Refill, Emergency Department We have refilled your medication today as a courtesy to you. It is best for your medical care, however, to take care of getting refills done through your primary caregiver's office. They have your records and can do a better job of follow-up than we can in the emergency department. On maintenance medications, we often only prescribe enough medications to get you by until you are able to see your regular caregiver. This is a more expensive way to refill medications. In the future, please plan for refills so that you will not have to use the emergency department for this. Thank you for your help. Your help allows us to better take care of the daily emergencies that enter our department. Document Released: 03/27/2004 Document Revised: 03/02/2012 Document Reviewed: 12/09/2005 [facility] Patient Information 2014 [place], [place].  Hypertension Hypertension is another name for high blood pressure. High blood pressure may mean that your heart needs to work harder to pump blood. Blood pressure consists of two numbers, which includes a higher number over a lower number (example: 110/72). HOME CARE   Make lifestyle changes as told by your doctor. This may include weight loss and exercise.  Take your blood pressure medicine every day.  Limit how much salt you use.  Stop smoking if you smoke.  Do not use drugs.  Talk to your doctor if you are using decongestants or birth control pills. These medicines might make blood pressure higher.  Females should not drink more than 1 alcoholic drink per day.  Males should not drink more than 2 alcoholic drinks per day.  See your doctor as told. GET HELP RIGHT AWAY IF:   You have a blood pressure reading with a top number of 180 or higher.  You get a very bad headache.  You get blurred or changing vision.  You feel confused.  You feel weak, numb, or faint.  You get chest or belly (abdominal) pain.  You throw up (vomit).  You cannot breathe very well. MAKE SURE YOU:   Understand these instructions.  Will watch your condition.  Will get help right away if you are not doing well or get worse. Document Released: 05/27/2008 Document Revised: 03/02/2012 Document Reviewed: 05/27/2008 [facility] Patient Information 2014 [place], [place].

## 2014-01-07 NOTE — ED Notes (Signed)
Pt here for refills on :  CLONIDINE 0.2mg  (1.5 TABS) TID  HYDRALYZINE 50MG  BID  AMLODIPINE 10 MG DAILY  LISINOPRIL 20/25 MG  BID  Pt seems to be very determined to take his meds as prescribed and recognizes the seriousness of this.

## 2014-01-07 NOTE — ED Notes (Signed)
[place], PA at bedside for evaluation. 

## 2014-01-07 NOTE — ED Notes (Signed)
Pt states he missed the free clinic to get his bp meds refilled this weekend and now he has run out. He has been taking half of some of the pills to make them last longer. He denies any physical complaints

## 2015-06-15 ENCOUNTER — Emergency Department (HOSPITAL_COMMUNITY)
Admission: EM | Admit: 2015-06-15 | Discharge: 2015-06-15 | Disposition: A | Discharge status: Home / Self Care | Payer: 59 | Attending: Emergency Medicine | Admitting: Emergency Medicine

## 2015-06-15 ENCOUNTER — Encounter (HOSPITAL_COMMUNITY): Payer: Self-pay | Admitting: Emergency Medicine

## 2015-06-15 DIAGNOSIS — Z760 Encounter for issue of repeat prescription: Secondary | ICD-10-CM | POA: Diagnosis present

## 2015-06-15 DIAGNOSIS — I10 Essential (primary) hypertension: Secondary | ICD-10-CM

## 2015-06-15 DIAGNOSIS — Z87891 Personal history of nicotine dependence: Secondary | ICD-10-CM | POA: Diagnosis not present

## 2015-06-15 DIAGNOSIS — Z79899 Other long term (current) drug therapy: Secondary | ICD-10-CM | POA: Insufficient documentation

## 2015-06-15 LAB — I-STAT CHEM 8, ED
BUN: 16 mg/dL (ref 6–20)
CALCIUM ION: 1.21 mmol/L (ref 1.12–1.23)
CHLORIDE: 103 mmol/L (ref 101–111)
CREATININE: 1.1 mg/dL (ref 0.61–1.24)
GLUCOSE: 96 mg/dL (ref 65–99)
HCT: 48 % (ref 39.0–52.0)
Hemoglobin: 16.3 g/dL (ref 13.0–17.0)
Potassium: 3.4 mmol/L — ABNORMAL LOW (ref 3.5–5.1)
Sodium: 143 mmol/L (ref 135–145)
TCO2: 28 mmol/L (ref 0–100)

## 2015-06-15 MED ORDER — HYDROCHLOROTHIAZIDE 25 MG PO TABS: 25.0000 mg | ORAL_TABLET | Freq: Two times a day (BID) | ORAL | Status: DC

## 2015-06-15 MED ORDER — CLONIDINE HCL 0.1 MG PO TABS: 0.1000 mg | ORAL_TABLET | Freq: Three times a day (TID) | ORAL | Status: DC

## 2015-06-15 MED ORDER — HYDRALAZINE HCL 100 MG PO TABS: 100.0000 mg | ORAL_TABLET | Freq: Two times a day (BID) | ORAL | Status: DC

## 2015-06-15 NOTE — ED Provider Notes (Signed)
CSN: [id number]     Arrival date & time 06/15/15  1712 History  This chart was scribed for non-physician practitioner,Hlee Fringer Biagio Borg, working with Benjiman Core, MD, by Budd Palmer ED Scribe. This patient was seen in room TR11C/TR11C and the patient's care was started at 6:09 PM    Chief Complaint  Patient presents with  . Medication Refill   The history is provided by the patient. No language interpreter was used.   HPI Comments: Cory Rivera is a 44 y.o. male who presents to the Emergency Department to obtain a HTN medication refill and referral to a new PCP/clinic. He states that he moved to [place], and has now moved back to [place] and does not have care established. He requests a refill of clonidine, 0.1mg  3x daily, hydrochlorothiazide 25 mg 2x daily, and hydralazine 100 mg 2x daily. He states that he has been "stretching them out" for the past 2 days.  He regularly checks his BP at home with a cuff.  He states that he is currently on Medicaid. He denies CP, SOB, HA, lightheadedness, and dizziness.  Past Medical History  Diagnosis Date  . Hypertension    History reviewed. No pertinent past surgical history. Family History  Problem Relation Age of Onset  . Cancer Mother     breast  . Cancer Father   . Heart failure Father   . Alcoholism Father    History  Substance Use Topics  . Smoking status: Former Games developer  . Smokeless tobacco: Not on file     Comment: smokes marijuana daily, no tobacco  . Alcohol Use: Yes    Review of Systems  Respiratory: Negative for shortness of breath.   Cardiovascular: Negative for chest pain.  Gastrointestinal: Negative for abdominal pain.  Allergic/Immunologic: Negative for immunocompromised state.  Neurological: Negative for dizziness, light-headedness and headaches.  Hematological: Does not bruise/bleed easily.  Psychiatric/Behavioral: Negative for confusion.    Allergies  Review of patient's allergies indicates no known  allergies.  Home Medications   Prior to Admission medications   Medication Sig Start Date End Date Taking? Authorizing Provider  cloNIDine (CATAPRES) 0.2 MG tablet Take 1.5 tablets (0.3 mg total) by mouth 3 (three) times daily. 01/07/14  Yes Trixie Dredge, PA-C  hydrALAZINE (APRESOLINE) 50 MG tablet Take 1 tablet (50 mg total) by mouth 2 (two) times daily. 01/07/14  Yes Trixie Dredge, PA-C  hydrochlorothiazide (HYDRODIURIL) 25 MG tablet Take 25 mg by mouth 2 (two) times daily.   Yes Historical Provider, MD  amLODipine (NORVASC) 10 MG tablet Take 1 tablet (10 mg total) by mouth daily. Patient not taking: Reported on 06/15/2015 01/07/14   Trixie Dredge, PA-C  lisinopril-hydrochlorothiazide (PRINZIDE,ZESTORETIC) 20-25 MG per tablet Take 1 tablet by mouth 2 (two) times daily. Patient not taking: Reported on 06/15/2015 01/07/14   Trixie Dredge, PA-C   BP 196/118 mmHg  Pulse 98  Temp(Src) 98.5 F (36.9 C) (Oral)  Resp 18  Ht 5' 6.5" (1.689 m)  Wt 215 lb (97.523 kg)  BMI 34.19 kg/m2  SpO2 98% Physical Exam  Constitutional: He appears well-developed and well-nourished. No distress.  HENT:  Head: Normocephalic and atraumatic.  Neck: Neck supple.  Cardiovascular: Normal rate and regular rhythm.   Pulmonary/Chest: Effort normal and breath sounds normal. No respiratory distress. He has no wheezes. He has no rales.  Neurological: He is alert.  Skin: He is not diaphoretic.  Psychiatric: He has a normal mood and affect. His behavior is normal.  Nursing note and vitals  reviewed.   ED Course  Procedures  DIAGNOSTIC STUDIES: Oxygen Saturation is 98% on RA, normal by my interpretation.    COORDINATION OF CARE: 6:12 PM - Discussed plans to refill medications once kidney function has been assessed. Recommended pt to [facility]. Pt advised of plan for treatment and pt agrees.  Labs Review Labs Reviewed  I-STAT CHEM 8, ED - Abnormal; Notable for the following:    Potassium 3.4 (*)    All other components  within normal limits    Imaging Review No results found.   EKG Interpretation None      MDM   Final diagnoses:  Essential hypertension    Afebrile, nontoxic patient with chronic hypertension, out of medications, has been stretching out his medications.  He is asymptomatic.  Renal function is normal.   D/C home with prescriptions for home medications, [facility] follow up.  Discussed result, findings, treatment, and follow up  with patient.  Pt given return precautions.  Pt verbalizes understanding and agrees with plan.      I personally performed the services described in this documentation, which was scribed in my presence. The recorded information has been reviewed and is accurate.   Trixie Dredge, PA-C 06/15/15 2224  Benjiman Core, MD 06/18/15 [phone]

## 2015-06-15 NOTE — ED Notes (Signed)
Pt st's he recently moved back to [place] from [place] and needs refill on his blood pressure meds.  Pt also requesting referral for new MD

## 2015-06-15 NOTE — ED Notes (Signed)
Pt here for htn meds refill and referral to new PCP; pt just relocated here

## 2015-06-15 NOTE — Discharge Instructions (Signed)
Read the information below.  Use the prescribed medication as directed.  Please discuss all new medications with your pharmacist.  You may return to the Emergency Department at any time for worsening condition or any new symptoms that concern you.   If you develop chest pain, shortness of breath, weakness or numbness in your arms or legs, difficulty speaking or walking, return to the ER for a recheck.      DASH Eating Plan DASH stands for "Dietary Approaches to Stop Hypertension." The DASH eating plan is a healthy eating plan that has been shown to reduce high blood pressure (hypertension). Additional health benefits may include reducing the risk of type 2 diabetes mellitus, heart disease, and stroke. The DASH eating plan may also help with weight loss. WHAT DO I NEED TO KNOW ABOUT THE DASH EATING PLAN? For the DASH eating plan, you will follow these general guidelines:  Choose foods with a percent daily value for sodium of less than 5% (as listed on the food label).  Use salt-free seasonings or herbs instead of table salt or sea salt.  Check with your health care provider or pharmacist before using salt substitutes.  Eat lower-sodium products, often labeled as "lower sodium" or "no salt added."  Eat fresh foods.  Eat more vegetables, fruits, and low-fat dairy products.  Choose whole grains. Look for the word "whole" as the first word in the ingredient list.  Choose fish and skinless chicken or [place] more often than red meat. Limit fish, poultry, and meat to 6 oz (170 g) each day.  Limit sweets, desserts, sugars, and sugary drinks.  Choose heart-healthy fats.  Limit cheese to 1 oz (28 g) per day.  Eat more home-cooked food and less restaurant, buffet, and fast food.  Limit fried foods.  Cook foods using methods other than frying.  Limit canned vegetables. If you do use them, rinse them well to decrease the sodium.  When eating at a restaurant, ask that your food be prepared  with less salt, or no salt if possible. WHAT FOODS CAN I EAT? Seek help from a dietitian for individual calorie needs. Grains Whole grain or whole wheat bread. Brown rice. Whole grain or whole wheat pasta. Quinoa, bulgur, and whole grain cereals. Low-sodium cereals. Corn or whole wheat flour tortillas. Whole grain cornbread. Whole grain crackers. Low-sodium crackers. Vegetables Fresh or frozen vegetables (raw, steamed, roasted, or grilled). Low-sodium or reduced-sodium tomato and vegetable juices. Low-sodium or reduced-sodium tomato sauce and paste. Low-sodium or reduced-sodium canned vegetables.  Fruits All fresh, canned (in natural juice), or frozen fruits. Meat and Other Protein Products Ground beef (85% or leaner), grass-fed beef, or beef trimmed of fat. Skinless chicken or [place]. Ground chicken or [place]. Pork trimmed of fat. All fish and seafood. Eggs. Dried beans, peas, or lentils. Unsalted nuts and seeds. Unsalted canned beans. Dairy Low-fat dairy products, such as skim or 1% milk, 2% or reduced-fat cheeses, low-fat ricotta or cottage cheese, or plain low-fat yogurt. Low-sodium or reduced-sodium cheeses. Fats and Oils Tub margarines without trans fats. Light or reduced-fat mayonnaise and salad dressings (reduced sodium). Avocado. Safflower, olive, or canola oils. Natural peanut or almond butter. Other Unsalted popcorn and pretzels. The items listed above may not be a complete list of recommended foods or beverages. Contact your dietitian for more options. WHAT FOODS ARE NOT RECOMMENDED? Grains White bread. White pasta. White rice. Refined cornbread. Bagels and croissants. Crackers that contain trans fat. Vegetables Creamed or fried vegetables. Vegetables in a cheese  sauce. Regular canned vegetables. Regular canned tomato sauce and paste. Regular tomato and vegetable juices. Fruits Dried fruits. Canned fruit in light or heavy syrup. Fruit juice. Meat and Other Protein Products Fatty  cuts of meat. Ribs, chicken wings, bacon, sausage, bologna, salami, chitterlings, fatback, hot dogs, bratwurst, and packaged luncheon meats. Salted nuts and seeds. Canned beans with salt. Dairy Whole or 2% milk, cream, half-and-half, and cream cheese. Whole-fat or sweetened yogurt. Full-fat cheeses or blue cheese. Nondairy creamers and whipped toppings. Processed cheese, cheese spreads, or cheese curds. Condiments Onion and garlic salt, seasoned salt, table salt, and sea salt. Canned and packaged gravies. Worcestershire sauce. Tartar sauce. Barbecue sauce. Teriyaki sauce. Soy sauce, including reduced sodium. Steak sauce. Fish sauce. Oyster sauce. Cocktail sauce. Horseradish. Ketchup and mustard. Meat flavorings and tenderizers. Bouillon cubes. Hot sauce. Tabasco sauce. Marinades. Taco seasonings. Relishes. Fats and Oils Butter, stick margarine, lard, shortening, ghee, and bacon fat. Coconut, palm kernel, or palm oils. Regular salad dressings. Other Pickles and olives. Salted popcorn and pretzels. The items listed above may not be a complete list of foods and beverages to avoid. Contact your dietitian for more information. WHERE CAN I FIND MORE INFORMATION? National Heart, Lung, and Blood Institute: [employer] Document Released: 11/28/2011 Document Revised: 04/25/2014 Document Reviewed: 10/13/2013 [facility] Patient Information 2015 [address], [place]. This information is not intended to replace advice given to you by your health care provider. Make sure you discuss any questions you have with your health care provider.  Hypertension Hypertension, commonly called high blood pressure, is when the force of blood pumping through your arteries is too strong. Your arteries are the blood vessels that carry blood from your heart throughout your body. A blood pressure reading consists of a higher number over a lower number, such as 110/72. The higher number (systolic) is  the pressure inside your arteries when your heart pumps. The lower number (diastolic) is the pressure inside your arteries when your heart relaxes. Ideally you want your blood pressure below 120/80. Hypertension forces your heart to work harder to pump blood. Your arteries may become narrow or stiff. Having hypertension puts you at risk for heart disease, stroke, and other problems.  RISK FACTORS Some risk factors for high blood pressure are controllable. Others are not.  Risk factors you cannot control include:   Race. You may be at higher risk if you are African American.  Age. Risk increases with age.  Gender. Men are at higher risk than women before age 57 years. After age 46, women are at higher risk than men. Risk factors you can control include:  Not getting enough exercise or physical activity.  Being overweight.  Getting too much fat, sugar, calories, or salt in your diet.  Drinking too much alcohol. SIGNS AND SYMPTOMS Hypertension does not usually cause signs or symptoms. Extremely high blood pressure (hypertensive crisis) may cause headache, anxiety, shortness of breath, and nosebleed. DIAGNOSIS  To check if you have hypertension, your health care provider will measure your blood pressure while you are seated, with your arm held at the level of your heart. It should be measured at least twice using the same arm. Certain conditions can cause a difference in blood pressure between your right and left arms. A blood pressure reading that is higher than normal on one occasion does not mean that you need treatment. If one blood pressure reading is high, ask your health care provider about having it checked again. TREATMENT  Treating high blood pressure includes making  lifestyle changes and possibly taking medicine. Living a healthy lifestyle can help lower high blood pressure. You may need to change some of your habits. Lifestyle changes may include:  Following the DASH diet. This  diet is high in fruits, vegetables, and whole grains. It is low in salt, red meat, and added sugars.  Getting at least 2 hours of brisk physical activity every week.  Losing weight if necessary.  Not smoking.  Limiting alcoholic beverages.  Learning ways to reduce stress. If lifestyle changes are not enough to get your blood pressure under control, your health care provider may prescribe medicine. You may need to take more than one. Work closely with your health care provider to understand the risks and benefits. HOME CARE INSTRUCTIONS  Have your blood pressure rechecked as directed by your health care provider.   Take medicines only as directed by your health care provider. Follow the directions carefully. Blood pressure medicines must be taken as prescribed. The medicine does not work as well when you skip doses. Skipping doses also puts you at risk for problems.   Do not smoke.   Monitor your blood pressure at home as directed by your health care provider. SEEK MEDICAL CARE IF:   You think you are having a reaction to medicines taken.  You have recurrent headaches or feel dizzy.  You have swelling in your ankles.  You have trouble with your vision. SEEK IMMEDIATE MEDICAL CARE IF:  You develop a severe headache or confusion.  You have unusual weakness, numbness, or feel faint.  You have severe chest or abdominal pain.  You vomit repeatedly.  You have trouble breathing. MAKE SURE YOU:   Understand these instructions.  Will watch your condition.  Will get help right away if you are not doing well or get worse. Document Released: 12/09/2005 Document Revised: 04/25/2014 Document Reviewed: 10/01/2013 [facility] Patient Information 2015 [place], [place]. This information is not intended to replace advice given to you by your health care provider. Make sure you discuss any questions you have with your health care provider.    Emergency Department Resource Guide 1)  Find a Doctor and Pay Out of Pocket Although you won't have to find out who is covered by your insurance plan, it is a good idea to ask around and get recommendations. You will then need to call the office and see if the doctor you have chosen will accept you as a new patient and what types of options they offer for patients who are self-pay. Some doctors offer discounts or will set up payment plans for their patients who do not have insurance, but you will need to ask so you aren't surprised when you get to your appointment.  2) Contact Your Local Health Department Not all health departments have doctors that can see patients for sick visits, but many do, so it is worth a call to see if yours does. If you don't know where your local health department is, you can check in your phone book. The CDC also has a tool to help you locate your state's health department, and many state websites also have listings of all of their local health departments.  3) Find a Walk-in Clinic If your illness is not likely to be very severe or complicated, you may want to try a walk in clinic. These are popping up all over the country in pharmacies, drugstores, and shopping centers. They're usually staffed by nurse practitioners or physician assistants that have been trained to  treat common illnesses and complaints. They're usually fairly quick and inexpensive. However, if you have serious medical issues or chronic medical problems, these are probably not your best option.  No Primary Care Doctor: - Call Health Connect at  [phone] - they can help you locate a primary care doctor that  accepts your insurance, provides certain services, etc. - Physician Referral Service- [phone]  Chronic Pain Problems: Organization         Address  Phone   Notes  [facility]  [phone] Patients need to be referred by their primary care doctor.   Medication Assistance: Organization         Address  Phone    Notes  [facility] Medication [facility] [address] [place] [place], [place] [address] [phone] --Must be a resident of [place] -- Must have NO insurance coverage whatsoever (no Medicaid/ Medicare, etc.) -- The pt. MUST have a primary care doctor that directs their care regularly and follows them in the community   [facility]  [phone]   [employer]  [phone]    Agencies that provide inexpensive medical care: Organization         Address  Phone   Notes  [facility]  [phone]   [facility]    [phone]   [facility] [address] [place], [place] [address] [phone]   [facility] [address] [place], [place] [phone]   Planned Parenthood    [phone]   [facility]    [phone]   [facility]  [address], [place] Phone:  [phone], Fax:  [phone] Hours of Operation:  9 am - 6 pm, M-F.  Also accepts Medicaid/Medicare and self-pay.  [facility]  [address], [place] Phone: [phone], Fax: [phone]. Hours of Operation:  8:30 am - 5:30 pm, M-F.  Also accepts Medicaid and self-pay.  [facility] [place] [address] [place] Phone: [phone]   [facility] [address] Natasha Bence [place], [place] [phone], Ext. [phone] Mondays & Thursdays: 7-9 AM.  First 15 patients are seen on a first come, first serve basis.    Medicaid-accepting [facility] Providers:  Organization         Address  Phone   Notes  [facility] [address], [place] [phone] Also accepts self-pay patients.  [facility] [address] Laurell Josephs [place], [place]  [phone]   [facility] [address], [place] [phone]   [facility] Family  Medicine [address] [place] [phone]   Renaye Rakers [address], [place]   [phone] Only accepts [place] Access [place] patients after they have their name applied to their card.   Self-Pay (no insurance) in [facility]:  Organization         Address  Phone   Notes  Sickle Cell Patients, [facility] Internal Medicine [address] [place], [place] [phone]   [facility] [address] [place], [place] [phone]   [facility] [place]  [address], [place] [phone]   [facility]/Dr. Osei-Bonsu  [address] [place] [address] [place] or [address], [place] [phone] Phone number for both [facility] and [place] locations is the same.  [facility] [address], [place] [phone]   [facility] [address], [place] or [address] [phone]   [facility] [address] [place] [phone], phone; [phone], fax Sees patients 1st and 3rd Saturday of every month.  Must not qualify for public or private insurance (i.e. Medicaid, Medicare, [place] Health Choice, Veterans' Benefits)  Household income should be no more than 200% of the poverty level The clinic cannot treat you if you are pregnant or think you are pregnant  Sexually transmitted diseases are not treated at the clinic.    Dental Care: Organization         Address  Phone  Notes  [facility] Department of [facility] [address] [place], [place] [phone] Accepts children up to age 10 who are enrolled in [place] or [place] Health Choice; pregnant women with a Medicaid card; and children who have applied for Medicaid or [place] Health Choice, but were declined, whose parents can pay a reduced fee at time of service.  [facility] Department of [facility]  [address], [place] [phone] Accepts children up to age 41 who are enrolled in [place] or [place] Health Choice; pregnant women with a Medicaid card; and children who have applied for Medicaid or [place] Health Choice, but were declined, whose parents can pay a reduced fee at time of service.  [facility]  [address] [place], [place] [phone] Patients are seen by appointment only. Walk-ins are not accepted. [facility] will see patients 45 years of age and older. Monday - Tuesday (8am-5pm) Most Wednesdays (8:30-5pm) $30 per visit, cash only  [facility] Adult Dental Access PRO[facility]  [address], [facility] [phone] Patients are seen by appointment only. Walk-ins are not accepted. [facility] will see patients 67 years of age and older. One Wednesday Evening (Monthly: Volunteer Based).  $30 per visit, cash only  [employer]  [phone] for adults; Children under age 79, call Graduate Pediatric Dentistry at [phone]. Children aged 56-14, please call [phone] to request a pediatric application.  Dental services are provided in all areas of dental care including fillings, crowns and bridges, complete and partial dentures, implants, gum treatment, root canals, and extractions. Preventive care is also provided. Treatment is provided to both adults and children. Patients are selected via a lottery and there is often a waiting list.   [facility] [address], [place]  [phone] [email or web address]   [facility] [address] [place], [place] [phone], Ext. [phone] Second and Fourth Thursday of each month, opens at 6:30 AM; Clinic ends at 9 AM.  Patients are seen on a first-come first-served basis, and a limited number are seen during each clinic.   [facility]  [address] Ether Griffins [place], [place] [phone]   Eligibility Requirements You must have lived in  [place], [place], or [place] counties for at least the last three months.   You cannot be eligible for state or federal sponsored [place], including [employer], [place], or [employer].   You generally cannot be eligible for healthcare insurance through your employer.    How to apply: Eligibility screenings are held every  Tuesday and Wednesday afternoon from 1:00 pm until 4:00 pm. You do not need an appointment for the interview!  [facility] [address] [place], [place] [phone]   [facility] Health Department  [phone]   [facility] Health Department  [phone]   [facility] Health Department  [phone]    Behavioral Health Resources in the Community: Intensive Outpatient Programs Organization         Address  Phone  Notes  [facility] Services [address] [place], [place] [phone]   [facility] Outpatient [address] [place], [place] [phone]   [facility] [address] [place], [place]  [phone]   [facility] [address],  [place], [place] [phone] or [phone]   Substance Abuse Resources Organization         Address  Phone  Notes  [facility]  [phone]   [facility]  [phone]   [facility]  [phone]   Floydene Flock  [phone]   Residential & Outpatient Substance Abuse Program  [phone]   Psychological Services Organization         Address  Phone  Notes  [facility] Behavioral Health  [phone]   [facility]  [phone]   [facility] [address], [place] [phone] or [phone]    Mobile Crisis Teams Organization         Address  Phone  Notes  [facility], Mobile Crisis Care Unit  [phone]   [facility]  [address] [place], [place] [phone]   Doristine Locks [address] [place] [phone]    Self-Help/Support Groups Organization         Address  Phone             Notes  [facility] [place] - variety of support groups  336- I7437963 Call for more information  Narcotics Anonymous (NA), [facility] [address], [employer] [place]  2 meetings at this location   Statistician         Address  Phone  Notes  [facility] [address],    [place]  [phone]   [facility]  [address] [place] [address], [place], [place] [phone]   [facility] Treatment Facility [address] [place], [place] [phone] Admissions: 8am-3pm M-F  [facility] [address],    [place], [place] [phone]   [facility] [address] [place], [place], [place] [phone]   [facility] [address],  Zayyan Mullen [place], [place] [phone]   [facility] - Intensive Outpatient [address] Laurell Joseph[address], [place], [place] [phone]   [facility] ([facility] [address]  [place], [place] [phone] or [phone]   [facility] ([facility]) [address], [place], [place] [phone] Accepts Medicaid  [facility] [address]  [place] [phone] Substance Abuse/Addiction Treatment   [facility] Organization         Address  Phone  Notes  [facility]  5174423223   Angie Fava, PhD [address] Ervin Knack [place], [place]   [phone] or [phone]   [facility]   [address] [place], [place] [phone]   [facility] [address], [place], [place] [phone] Insurance/Medicaid/sponsorship through [facility]  [facility] [address]                                    [place], [place] [phone] Therapy/tele-psych/case  [facility] [address]   [place], [place] [phone]    Dr. Lolly Mustache  [phone]   [facility] [place]  [employer] [facility] Dept. 1) [address], [place] 2) [address], [place] 3)  [address], [place] [phone]  [phone]   [facility] Child Abuse Hotline [phone] or 4697995958 (After Hours)

## 2015-09-20 ENCOUNTER — Emergency Department (HOSPITAL_COMMUNITY)
Admission: EM | Admit: 2015-09-20 | Discharge: 2015-09-20 | Disposition: A | Discharge status: Home / Self Care | Payer: 59 | Attending: Emergency Medicine | Admitting: Emergency Medicine

## 2015-09-20 ENCOUNTER — Encounter (HOSPITAL_COMMUNITY): Payer: Self-pay

## 2015-09-20 DIAGNOSIS — Z79899 Other long term (current) drug therapy: Secondary | ICD-10-CM | POA: Insufficient documentation

## 2015-09-20 DIAGNOSIS — I10 Essential (primary) hypertension: Secondary | ICD-10-CM | POA: Insufficient documentation

## 2015-09-20 DIAGNOSIS — Z87891 Personal history of nicotine dependence: Secondary | ICD-10-CM | POA: Insufficient documentation

## 2015-09-20 DIAGNOSIS — Z760 Encounter for issue of repeat prescription: Secondary | ICD-10-CM | POA: Insufficient documentation

## 2015-09-20 MED ORDER — HYDRALAZINE HCL 100 MG PO TABS: 100.0000 mg | ORAL_TABLET | Freq: Two times a day (BID) | ORAL | Status: DC

## 2015-09-20 MED ORDER — HYDROCHLOROTHIAZIDE 25 MG PO TABS: 25.0000 mg | ORAL_TABLET | Freq: Two times a day (BID) | ORAL | Status: DC

## 2015-09-20 MED ORDER — CLONIDINE HCL 0.2 MG PO TABS
0.2000 mg | ORAL_TABLET | Freq: Three times a day (TID) | ORAL | Status: DC
Start: 2015-09-20 — End: 2015-10-10

## 2015-09-20 NOTE — ED Notes (Signed)
Pt requesting BP med rxs.  Denies any complaints at this time

## 2015-09-20 NOTE — ED Provider Notes (Signed)
CSN: [id number]     Arrival date & time 09/20/15  1941 History  By signing my name below, I, Cory Rivera, attest that this documentation has been prepared under the direction and in the presence of [name], PA-C. Electronically Signed: [name], ED Scribe. 09/20/2015. 9:57 PM.  Chief Complaint  Patient presents with  . Medication Refill   The history is provided by the patient. No language interpreter was used.   PCP: No PCP Per Patient HPI Comments: Cory Rivera is a 44 y.o. male, with PMHx noted below, who presents to the Emergency Department for refill of his clonidine, hydrochlorothiazide, and hydralazine. Pt reports he took all his meds this morning. Pt requests a PCP referral as well. Pt denies chest pain, SOB, swelling of BLE.   Past Medical History  Diagnosis Date  . Hypertension    History reviewed. No pertinent past surgical history. Family History  Problem Relation Age of Onset  . Cancer Mother     breast  . Cancer Father   . Heart failure Father   . Alcoholism Father    Social History  Substance Use Topics  . Smoking status: Former Games developer  . Smokeless tobacco: None     Comment: smokes marijuana daily, no tobacco  . Alcohol Use: Yes    Review of Systems  Constitutional: Negative for fever.  Respiratory: Negative for shortness of breath.   Cardiovascular: Negative for chest pain and leg swelling.   Allergies  Review of patient's allergies indicates no known allergies.  Home Medications   Prior to Admission medications   Medication Sig Start Date End Date Taking? Authorizing Provider  cloNIDine (CATAPRES) 0.1 MG tablet Take 1 tablet (0.1 mg total) by mouth 3 (three) times daily. 06/15/15  Yes Trixie Dredge, PA-C  hydrALAZINE (APRESOLINE) 100 MG tablet Take 1 tablet (100 mg total) by mouth 2 (two) times daily. 06/15/15  Yes Trixie Dredge, PA-C  hydrochlorothiazide (HYDRODIURIL) 25 MG tablet Take 1 tablet (25 mg total) by mouth 2 (two) times daily.  06/15/15  Yes Trixie Dredge, PA-C   Triage Vitals: BP 149/79 mmHg  Pulse 79  Temp(Src) 98.7 F (37.1 C) (Oral)  Resp 18  Ht  (1.702 m)  Wt 207 lb (93.895 kg)  BMI 32.41 kg/m2  SpO2 98% Physical Exam  Constitutional: He is oriented to person, place, and time. He appears well-developed and well-nourished.  HENT:  Head: Normocephalic.  Eyes: EOM are normal.  Neck: Normal range of motion.  Cardiovascular: Normal rate and regular rhythm.   Pulmonary/Chest: Effort normal and breath sounds normal. No respiratory distress.  Abdominal: He exhibits no distension.  Musculoskeletal: Normal range of motion.  Neurological: He is alert and oriented to person, place, and time.  Psychiatric: He has a normal mood and affect.  Nursing note and vitals reviewed.  ED Course  Procedures (including critical care time) DIAGNOSTIC STUDIES: Oxygen Saturation is 98% on RA, nl by my interpretation.    COORDINATION OF CARE: 9:56 PM: Discussed treatment plan which includes meds refill and PCP referral with pt at bedside; patient verbalizes understanding and agrees with treatment plan.  MDM   Final diagnoses:  None    1. Medication refill 2. History of hypertension  Asymptomatic patient that ran out of medication earlier today. Has missed no regular dosing. Stable for discharge. Resources provided for PCP for further management.   I personally performed the services described in this documentation, which was scribed in my presence. The recorded information has been  reviewed and is accurate.     [name], PA-C 09/21/15 2311  Bethann Berkshire, MD 09/25/15 [phone]

## 2015-09-20 NOTE — Discharge Instructions (Signed)
Medication Refill, Emergency Department °We have refilled your medication today as a courtesy to you. It is best for your medical care, however, to take care of getting refills done through your primary caregiver's office. They have your records and can do a better job of follow-up than we can in the emergency department. °On maintenance medications, we often only prescribe enough medications to get you by until you are able to see your regular caregiver. This is a more expensive way to refill medications. °In the future, please plan for refills so that you will not have to use the emergency department for this. °Thank you for your help. Your help allows us to better take care of the daily emergencies that enter our department. °Document Released: 03/27/2004 Document Revised: 03/02/2012 Document Reviewed: 03/18/2014 °ExitCare® Patient Information ©2015 [employer]. This information is not intended to replace advice given to you by your health care provider. Make sure you discuss any questions you have with your health care provider. ° ° °Emergency Department Resource Guide °1) Find a Doctor and Pay Out of Pocket °Although you won't have to find out who is covered by your insurance plan, it is a good idea to ask around and get recommendations. You will then need to call the office and see if the doctor you have chosen will accept you as a new patient and what types of options they offer for patients who are self-pay. Some doctors offer discounts or will set up payment plans for their patients who do not have insurance, but you will need to ask so you aren't surprised when you get to your appointment. ° °2) Contact Your Local Health Department °Not all health departments have doctors that can see patients for sick visits, but many do, so it is worth a call to see if yours does. If you don't know where your local health department is, you can check in your phone book. The CDC also has a tool to help you locate your  state's health department, and many state websites also have listings of all of their local health departments. ° °3) Find a Walk-in Clinic °If your illness is not likely to be very severe or complicated, you may want to try a walk in clinic. These are popping up all over the country in pharmacies, drugstores, and shopping centers. They're usually staffed by nurse practitioners or physician assistants that have been trained to treat common illnesses and complaints. They're usually fairly quick and inexpensive. However, if you have serious medical issues or chronic medical problems, these are probably not your best option. ° °No Primary Care Doctor: °- Call [facility] at  832-8000 - they can help you locate a primary care doctor that  accepts your insurance, provides certain services, etc. °- Physician Referral Service- 1-800-533-3463 ° °Chronic Pain Problems: °Organization         Address  Phone   Notes  °[facility]  (336) 297-2271 Patients need to be referred by their primary care doctor.  ° °Medication Assistance: °Organization         Address  Phone   Notes  °[place] [facility] [address] °[place], [place] [address] (336) 641-8030 --Must be a resident of [place] °-- Must have NO insurance coverage whatsoever (no Medicaid/ Medicare, etc.) °-- The pt. MUST have a primary care doctor that directs their care regularly and follows them in the community °  °[facility]  (866) 331-1348   °[employer]  (888) 892-1162   ° °Agencies that provide inexpensive medical care: °Organization         Address  Phone   Notes  °[facility]  (336) 832-8035   °[facility]    (336) 832-7272   °[facility] [address] °[place], [place] [address] (336) 832-4777   °[facility] [address], °[place] (336) 271-4999   °[facility]    (336) 373-0678   °[facility]    (336)  272-1050   °[facility] ° [address], [place] Phone:  (336) 832-4444, Fax:  (336) 832-4440 Hours of Operation:  9 am - 6 pm, M-F.  Also accepts Medicaid/Medicare and self-pay.  °[facility] ° [address], [place] Phone: (336) 832-3150, Fax: (336) 832-3151. Hours of Operation:  8:30 am - 5:30 pm, M-F.  Also accepts Medicaid and self-pay.  °[facility] [place] [address], [place] Phone: (336) 878-6027   °[facility] [address], [place], [place] (336)723-1848, Ext. 123 Mondays & Thursdays: 7-9 AM.  First 15 patients are seen on a first come, first serve basis. °  ° °Medicaid-accepting [place] Providers: ° °Organization         Address  Phone   Notes  °[facility] [address], [place] (336) 641-2100 Also accepts self-pay patients.  °[facility] [address], [place] ° (336) 856-9996   °[facility] [address], [place] (336) 288-8857   °[facility] [address] [place] [address], [place] (336) 299-7000   °Veita Bland [address], [place]  ° (336) 373-1557 Only accepts [place] Access Medicaid patients after they have their name applied to their card.  ° °Self-Pay (no insurance) in [place]: ° °Organization         Address  Phone   Notes  °Sickle Cell Patients, [facility] [address], [place] (336) 832-1970   °[facility] [address], [place] (336) 832-4400   °[facility] [place] [facility] ° [address], [place] (336) 992-4800   °[facility]/Dr. Osei-Bonsu ° [address] [place] [address], [place] or [address], [place] (336) 841-8500 Phone number for both [place] and [place] locations is the same.  °[facility] [address], [place] (336)  299-0000   °[facility] [address] [place] [address], [place] or [address] (336) 852-7530 °(336) 878-2260   °[facility] [address], [place] (336) 350-1642, phone; (336) 294-5005, fax Sees patients 1st and 3rd Saturday of every month.  Must not qualify for public or private insurance (i.e. Medicaid, Medicare, [place] Health Choice, Veterans' Benefits) • Household income should be no more than 200% of the poverty level •The clinic cannot treat you if you are pregnant or think you are pregnant • Sexually transmitted diseases are not treated at the clinic.  ° ° °Dental Care: °Organization         Address  Phone  Notes  °[facility] [address],  (336) 641-6152 Accepts children up to age 21 who are enrolled in Medicaid or [place] Health Choice; pregnant women with a Medicaid card; and   children who have applied for Medicaid or [place] Health Choice, but were declined, whose parents can pay a reduced fee at time of service.  °[facility] [place]  [address], [place] (336) 641-7733 Accepts children up to age 21 who are enrolled in Medicaid or [place] Health Choice; pregnant women with a Medicaid card; and children who have applied for Medicaid or [place] Health Choice, but were declined, whose parents can pay a reduced fee at time of service.  °[facility] ° [address], [place] (336) 641-4533 Patients are seen by appointment only. Walk-ins are not accepted. [facility] will see patients 18 years of age and older. °Monday - Tuesday (8am-5pm) °Most Wednesdays (8:30-5pm) °$30 per visit, cash only  °[facility] ° [address], [place] (336) 641-4533 Patients are seen by appointment only. Walk-ins are not accepted. [facility] will see patients 18 years of age and older. °One Wednesday Evening (Monthly: Volunteer  Based).  $30 per visit, cash only  °[facility]  (919) 537-3737 for adults; Children under age 4, call Graduate Pediatric Dentistry at (919) [phone]. Children aged 4-14, please call (919) 537-3737 to request a pediatric application. ° Dental services are provided in all areas of dental care including fillings, crowns and bridges, complete and partial dentures, implants, gum treatment, root canals, and extractions. Preventive care is also provided. Treatment is provided to both adults and children. °Patients are selected via a lottery and there is often a waiting list. °  °[facility] [address], °[place] ° (336) 763-8833 [email or web address] °  °[facility] [address], [place], [place] (336)723-1848, Ext. 123 Second and Fourth Thursday of each month, opens at 6:30 AM; Clinic ends at 9 AM.  Patients are seen on a first-come first-served basis, and a limited number are seen during each clinic.  ° °[facility] ° [address], [place], [place] (336) 723-7904   Eligibility Requirements °You must have lived in [place], [place], or [place] counties for at least the last three months. °  You cannot be eligible for state or federal sponsored healthcare insurance, including Veterans Administration, Medicaid, or Medicare. °  You generally cannot be eligible for healthcare insurance through your employer.  °  How to apply: °Eligibility screenings are held every Tuesday and Wednesday afternoon from 1:00 pm until 4:00 pm. You do not need an appointment for the interview!  °[facility] [address], [place], [place] 336-631-2330   °[facility]  336-342-8273   °[facility]  336-703-3100   °[facility]  336-570-6415   ° °Behavioral Health Resources in the Community: °Intensive Outpatient Programs °Organization         Address  Phone  Notes  °[facility] [address], [place], [place] 336-878-6098   °[facility] [address], [place], [place] 336-832-9800   °[facility] [address], [place], [place] ° 336-882-2125   °[facility] [address],  °, [place] 1-800-853-5163 or 336-641-4981   °Substance Abuse Resources °Organization         Address  Phone  Notes  °[facility]  336-882-2125   °[facility]  336-784-9470   °[facility]  336-285-9073   °[facility]  336-845-3988   °Residential &   Outpatient Substance Abuse Program  1-800-659-3381   °Psychological Services °Organization         Address  Phone  Notes  °[facility]  336- 832-9600   °[facility]  336- 378-7881   °[facility] [address], [place] 1-800-853-5163 or 336-641-4981   ° °Mobile Crisis Teams °Organization         Address  Phone  Notes  °[facility], Mobile Crisis Care Unit  1-877-626-1772   °Assertive °[facility] ° [address] [place], [place] 336-834-9664   °Sharon DeEsch [address] °[place] 336-554-5454   ° °Self-Help/Support Groups °Organization         Address  Phone             Notes  °[facility] [place] - variety of support groups  336- 373-1402 Call for more information  °Narcotics Anonymous (NA), [facility] [address], °[place]  2 meetings at this location  ° °Residential Treatment Programs °Organization         Address  Phone  Notes  °[facility] [address],    °[place]  1-866-801-8205   °[facility] ° [address], [place], [place] 704-293-8524   °[facility] [address], [place] 336-845-3988 Admissions: 8am-3pm M-F  °[facility] [address]    °[place], [place] 336-841-1104   °[facility] [address], [place], [place] 336-379-7146    °[facility] [address]  °[place], [place] 336-285-9073   °[facility] - Intensive Outpatient [address], [place], [place] 336-852-3033   °[facility] [address]  °[place], [place] 1-877-615-2722 or 336-784-9470   °[facility] [address] [place], [place] 336-227-7417 Accepts Medicaid  °[facility] [address]  ° [place] 1-800-659-3381 Substance Abuse/Addiction Treatment  ° °[place] Behavioral Health Resources °Organization         Address  Phone  Notes  °[facility]  (888) 581-9988   °Julie Brannon, PhD [address] [place], [place]   (336) 349-5553 or (336) 951-0000   °[facility]   [address] °[place], [place] (336) 349-4454   °[facility] [address], [place], [place] (336) 342-8316 Insurance/Medicaid/sponsorship through [facility]  °[facility] [address]                                    [place], [place] (336) 342-8316 Therapy/tele-psych/case  °[facility] [address]  ° [place], [place] (336) 349-2233    °Dr. Arfeen  (336) 349-4544   °[facility] [place]  [employer] [place] [facility] 1) [address],  °2) [address], [place] °3)  [address], [place] (336) 349-3220 °(336) 342-7768 ° °(336) 342-8140   °[place] [facility] (336) 342-1394 or (336) 342-3537 (After Hours)    ° ° ° °

## 2015-09-20 NOTE — ED Notes (Signed)
Pt took last dose of his blood pressure medication this morning, if he can get a 30 day prescription from [place] that would give him time to get a primary doctor here and get his medications

## 2015-10-10 ENCOUNTER — Encounter: Payer: Self-pay | Admitting: *Deleted

## 2015-10-10 ENCOUNTER — Emergency Department
Admission: EM | Admit: 2015-10-10 | Discharge: 2015-10-10 | Disposition: A | Discharge status: Home / Self Care | Payer: Self-pay | Attending: Emergency Medicine | Admitting: Emergency Medicine

## 2015-10-10 DIAGNOSIS — I10 Essential (primary) hypertension: Secondary | ICD-10-CM | POA: Insufficient documentation

## 2015-10-10 DIAGNOSIS — Z760 Encounter for issue of repeat prescription: Secondary | ICD-10-CM | POA: Insufficient documentation

## 2015-10-10 DIAGNOSIS — Z87891 Personal history of nicotine dependence: Secondary | ICD-10-CM | POA: Insufficient documentation

## 2015-10-10 MED ORDER — HYDRALAZINE HCL 100 MG PO TABS: 100.0000 mg | ORAL_TABLET | Freq: Two times a day (BID) | ORAL | Status: DC

## 2015-10-10 MED ORDER — HYDROCHLOROTHIAZIDE 25 MG PO TABS: 25.0000 mg | ORAL_TABLET | Freq: Every day | ORAL | Status: DC

## 2015-10-10 MED ORDER — CLONIDINE HCL 0.3 MG PO TABS: 0.3000 mg | ORAL_TABLET | Freq: Three times a day (TID) | ORAL | Status: DC

## 2015-10-10 NOTE — ED Provider Notes (Signed)
[facility] Emergency Department Provider Note  ____________________________________________  Time seen: Approximately 5:46 PM  I have reviewed the triage vital signs and the nursing notes.   HISTORY  Chief Complaint Medication Refill    HPI Cory Rivera is a 44 y.o. male since for evaluation of medication refills only. Denies any emergency medical complaints at this time states he needs a refill on his clonidine, hydrocortisone, and hydralazine. Patient states has appointment in 2 weeks and 2 days.   Past Medical History  Diagnosis Date  . Hypertension     Patient Active Problem List   Diagnosis Date Noted  . Cardiomegaly - hypertensive 03/15/2012  . Chronic constipation 03/15/2012  . Lactose intolerance 03/15/2012  . Smoker 03/15/2012  . Hypertension     History reviewed. No pertinent past surgical history.  Current Outpatient Rx  Name  Route  Sig  Dispense  Refill  . cloNIDine (CATAPRES) 0.3 MG tablet   Oral   Take 1 tablet (0.3 mg total) by mouth 3 (three) times daily.   90 tablet   0   . hydrALAZINE (APRESOLINE) 100 MG tablet   Oral   Take 1 tablet (100 mg total) by mouth 2 (two) times daily.   60 tablet   0   . hydrochlorothiazide (HYDRODIURIL) 25 MG tablet   Oral   Take 1 tablet (25 mg total) by mouth daily.   30 tablet   0     Allergies Review of patient's allergies indicates no known allergies.  Family History  Problem Relation Age of Onset  . Cancer Mother     breast  . Cancer Father   . Heart failure Father   . Alcoholism Father     Social History Social History  Substance Use Topics  . Smoking status: Former Games developermoker  . Smokeless tobacco: None     Comment: smokes marijuana daily, no tobacco  . Alcohol Use: Yes    Review of Systems Constitutional: No fever/chills Eyes: No visual changes. ENT: No sore throat. Cardiovascular: Denies chest pain. Respiratory: Denies shortness of  breath. Gastrointestinal: No abdominal pain.  No nausea, no vomiting.  No diarrhea.  No constipation. Genitourinary: Negative for dysuria. Musculoskeletal: Negative for back pain. Skin: Negative for rash. Neurological: Negative for headaches, focal weakness or numbness.  10-point ROS otherwise negative.  ____________________________________________   PHYSICAL EXAM:  VITAL SIGNS: ED Triage Vitals  Enc Vitals Group     BP 10/10/15 1740 163/92 mmHg     Pulse Rate 10/10/15 1740 92     Resp 10/10/15 1740 18     Temp 10/10/15 1740 98.6 F (37 C)     Temp Source 10/10/15 1740 Oral     SpO2 10/10/15 1740 97 %     Weight 10/10/15 1740 200 lb (90.719 kg)     Height 10/10/15 1740 5\' 6"  (1.676 m)     Head Cir --      Peak Flow --      Pain Score --      Pain Loc --      Pain Edu? --      Excl. in GC? --     Constitutional: Alert and oriented. Well appearing and in no acute distress. Eyes: Conjunctivae are normal. PERRL. EOMI. Head: Atraumatic. Nose: No congestion/rhinnorhea. Mouth/Throat: Mucous membranes are moist.  Oropharynx non-erythematous. Neck: No stridor.   Cardiovascular: Normal rate, regular rhythm. Grossly normal heart sounds.  Good peripheral circulation. Respiratory: Normal respiratory effort.  No retractions. Lungs  CTAB. Musculoskeletal: No lower extremity tenderness nor edema.  No joint effusions. Neurologic:  Normal speech and language. No gross focal neurologic deficits are appreciated. No gait instability. Skin:  Skin is warm, dry and intact. No rash noted. Psychiatric: Mood and affect are normal. Speech and behavior are normal.  ____________________________________________   LABS (all labs ordered are listed, but only abnormal results are displayed)  Labs Reviewed - No data to display ____________________________________________    PROCEDURES  Procedure(s) performed: None  Critical Care performed:  No  ____________________________________________   INITIAL IMPRESSION / ASSESSMENT AND PLAN / ED COURSE  Pertinent labs & imaging results that were available during my care of the patient were reviewed by me and considered in my medical decision making (see chart for details).  Medication refill. Rx given for HCTZ, hydralazine, and clonidine. Patient follow-up with PCP in 2 weeks as directed. Patient voices no other emergency medical complaints at this time. ____________________________________________   FINAL CLINICAL IMPRESSION(S) / ED DIAGNOSES  Final diagnoses:  Encounter for medication refill      Evangeline Dakin, PA-C 10/10/15 1759  Jeanmarie Plant, MD 10/10/15 [phone]

## 2015-10-10 NOTE — ED Notes (Signed)
Pt ran out of BP medication today. Has an appt with clinic in 2 weeks.

## 2015-10-10 NOTE — Discharge Instructions (Signed)
Medicine Refill at the Emergency Department  We have refilled your medicine today, but it is best for you to get refills through your primary health care provider's office. In the future, please plan ahead so you do not need to get refills from the emergency department.  If the medicine we refilled was a maintenance medicine, you may have received only enough to get you by until you are able to see your regular health care provider.     This information is not intended to replace advice given to you by your health care provider. Make sure you discuss any questions you have with your health care provider.     Document Released: 03/27/2004 Document Revised: 12/30/2014 Document Reviewed: 03/18/2014  Elsevier Interactive Patient Education 2016 [employer]vier Inc.

## 2015-10-10 NOTE — ED Notes (Signed)
Patient reports his medication prescription was prescribed wrong and is here to have it filled again. Patient has an appointment with his primary doctor in 2 weeks.

## 2015-12-07 ENCOUNTER — Encounter (HOSPITAL_COMMUNITY): Payer: Self-pay | Admitting: Neurology

## 2015-12-07 ENCOUNTER — Emergency Department (HOSPITAL_COMMUNITY): Payer: Self-pay

## 2015-12-07 ENCOUNTER — Emergency Department (HOSPITAL_COMMUNITY)
Admission: EM | Admit: 2015-12-07 | Discharge: 2015-12-07 | Disposition: A | Discharge status: Home / Self Care | Payer: Self-pay | Attending: Emergency Medicine | Admitting: Emergency Medicine

## 2015-12-07 DIAGNOSIS — S8012XA Contusion of left lower leg, initial encounter: Secondary | ICD-10-CM | POA: Insufficient documentation

## 2015-12-07 DIAGNOSIS — R000 Tachycardia, unspecified: Secondary | ICD-10-CM | POA: Insufficient documentation

## 2015-12-07 DIAGNOSIS — Y9289 Other specified places as the place of occurrence of the external cause: Secondary | ICD-10-CM | POA: Insufficient documentation

## 2015-12-07 DIAGNOSIS — Z79899 Other long term (current) drug therapy: Secondary | ICD-10-CM | POA: Insufficient documentation

## 2015-12-07 DIAGNOSIS — Y998 Other external cause status: Secondary | ICD-10-CM | POA: Insufficient documentation

## 2015-12-07 DIAGNOSIS — S93401A Sprain of unspecified ligament of right ankle, initial encounter: Secondary | ICD-10-CM

## 2015-12-07 DIAGNOSIS — I10 Essential (primary) hypertension: Secondary | ICD-10-CM | POA: Insufficient documentation

## 2015-12-07 DIAGNOSIS — Z87891 Personal history of nicotine dependence: Secondary | ICD-10-CM | POA: Insufficient documentation

## 2015-12-07 DIAGNOSIS — Y9389 Activity, other specified: Secondary | ICD-10-CM | POA: Insufficient documentation

## 2015-12-07 DIAGNOSIS — S93411A Sprain of calcaneofibular ligament of right ankle, initial encounter: Secondary | ICD-10-CM | POA: Insufficient documentation

## 2015-12-07 DIAGNOSIS — W228XXA Striking against or struck by other objects, initial encounter: Secondary | ICD-10-CM | POA: Insufficient documentation

## 2015-12-07 NOTE — Progress Notes (Signed)
Orthopedic Tech Progress Note Patient Details:  Cory Rivera [birth year] [id number]  Ortho Devices Type of Ortho Device: ASO, Crutches Ortho Device/Splint Location: rle Ortho Device/Splint Interventions: Application   Elisa Sorlie 12/07/2015, 2:43 PM

## 2015-12-07 NOTE — Discharge Instructions (Signed)
Your heart rate was elevated today and you did not wish to receive evaluation - please get rechecked by your family doctor.  Get rechecked immediately if you develop chest pain, shortness of breath, fevers, or new concerning symptoms.  Use the ankle brace and crutches for weight bearing as tolerated.  You can take tylenol, available over the counter for pain.     Ankle Sprain An ankle sprain is an injury to the strong, fibrous tissues (ligaments) that hold the bones of your ankle joint together.  CAUSES An ankle sprain is usually caused by a fall or by twisting your ankle. Ankle sprains most commonly occur when you step on the outer edge of your foot, and your ankle turns inward. People who participate in sports are more prone to these types of injuries.  SYMPTOMS   Pain in your ankle. The pain may be present at rest or only when you are trying to stand or walk.  Swelling.  Bruising. Bruising may develop immediately or within 1 to 2 days after your injury.  Difficulty standing or walking, particularly when turning corners or changing directions. DIAGNOSIS  Your caregiver will ask you details about your injury and perform a physical exam of your ankle to determine if you have an ankle sprain. During the physical exam, your caregiver will press on and apply pressure to specific areas of your foot and ankle. Your caregiver will try to move your ankle in certain ways. An X-ray exam may be done to be sure a bone was not broken or a ligament did not separate from one of the bones in your ankle (avulsion fracture).  TREATMENT  Certain types of braces can help stabilize your ankle. Your caregiver can make a recommendation for this. Your caregiver may recommend the use of medicine for pain. If your sprain is severe, your caregiver may refer you to a surgeon who helps to restore function to parts of your skeletal system (orthopedist) or a physical therapist. HOME CARE INSTRUCTIONS   Apply ice to your  injury for 1-2 days or as directed by your caregiver. Applying ice helps to reduce inflammation and pain.  Put ice in a plastic bag.  Place a towel between your skin and the bag.  Leave the ice on for 15-20 minutes at a time, every 2 hours while you are awake.  Only take over-the-counter or prescription medicines for pain, discomfort, or fever as directed by your caregiver.  Elevate your injured ankle above the level of your heart as much as possible for 2-3 days.  If your caregiver recommends crutches, use them as instructed. Gradually put weight on the affected ankle. Continue to use crutches or a cane until you can walk without feeling pain in your ankle.  If you have a plaster splint, wear the splint as directed by your caregiver. Do not rest it on anything harder than a pillow for the first 24 hours. Do not put weight on it. Do not get it wet. You may take it off to take a shower or bath.  You may have been given an elastic bandage to wear around your ankle to provide support. If the elastic bandage is too tight (you have numbness or tingling in your foot or your foot becomes cold and blue), adjust the bandage to make it comfortable.  If you have an air splint, you may blow more air into it or let air out to make it more comfortable. You may take your splint off at night  and before taking a shower or bath. Wiggle your toes in the splint several times per day to decrease swelling. SEEK MEDICAL CARE IF:   You have rapidly increasing bruising or swelling.  Your toes feel extremely cold or you lose feeling in your foot.  Your pain is not relieved with medicine. SEEK IMMEDIATE MEDICAL CARE IF:  Your toes are numb or blue.  You have severe pain that is increasing. MAKE SURE YOU:   Understand these instructions.  Will watch your condition.  Will get help right away if you are not doing well or get worse.   This information is not intended to replace advice given to you by your  health care provider. Make sure you discuss any questions you have with your health care provider.   Document Released: 12/09/2005 Document Revised: 12/30/2014 Document Reviewed: 12/21/2011 Elsevier Interactive Patient Education [employer]2016 Elsevier Inc.

## 2015-12-07 NOTE — ED Notes (Signed)
Pt reports 10 days ago he hit his right lower leg on a pole. Has a bruise to leg and hurts to walk. Had a referral for xray today but didn't have it done because he couldn't pay the copay. Pt is ambulatory.

## 2015-12-07 NOTE — ED Provider Notes (Signed)
CSN: [id number]     Arrival date & time 12/07/15  1149 History   First MD Initiated Contact with Patient 12/07/15 1213     Chief Complaint  Patient presents with  . Leg Pain      The history is provided by the patient. No language interpreter was used.   Cory Rivera is a 44 y.o. male who presents to the Emergency Department complaining of right leg pain.  He fell in the yard one week ago, tripping over a pipe and the pipe struck his distal right leg when he fell.  He has continued pain and swelling to the distal right leg and presents today due to ongoing pain.  He reports pain predominantly to the medial distal.    Past Medical History  Diagnosis Date  . Hypertension    History reviewed. No pertinent past surgical history. Family History  Problem Relation Age of Onset  . Cancer Mother     breast  . Cancer Father   . Heart failure Father   . Alcoholism Father    Social History  Substance Use Topics  . Smoking status: Former Games developer  . Smokeless tobacco: None     Comment: smokes marijuana daily, no tobacco  . Alcohol Use: Yes    Review of Systems  All other systems reviewed and are negative.     Allergies  Review of patient's allergies indicates no known allergies.  Home Medications   Prior to Admission medications   Medication Sig Start Date End Date Taking? Authorizing Provider  cloNIDine (CATAPRES) 0.3 MG tablet Take 1 tablet (0.3 mg total) by mouth 3 (three) times daily. 10/10/15 10/09/16 Yes Charles M Beers, PA-C  hydrALAZINE (APRESOLINE) 100 MG tablet Take 1 tablet (100 mg total) by mouth 2 (two) times daily. 10/10/15 10/09/16 Yes Charles M Beers, PA-C  hydrochlorothiazide (HYDRODIURIL) 25 MG tablet Take 1 tablet (25 mg total) by mouth daily. 10/10/15  Yes Charles M Beers, PA-C   BP 163/99 mmHg  Pulse 110  Temp(Src) 97.9 F (36.6 C) (Oral)  Resp 18  SpO2 97% Physical Exam  Constitutional: He is oriented to person, place, and time. He appears  well-developed and well-nourished.  HENT:  Head: Normocephalic and atraumatic.  Cardiovascular: Regular rhythm.   No murmur heard. tachycardic  Pulmonary/Chest: Effort normal and breath sounds normal. No respiratory distress.  Abdominal: Soft. There is no tenderness. There is no rebound and no guarding.  Musculoskeletal:  Moderate ecchymosis to the left leg with swelling on the distal medial leg.  TTP over the medial tibia distally as well as ankle.  Flexion/extension intact in the ankle.  2+ DP pulses.    Neurological: He is alert and oriented to person, place, and time.  Skin: Skin is warm and dry.  Psychiatric: He has a normal mood and affect. His behavior is normal.  Nursing note and vitals reviewed.   ED Course  Procedures (including critical care time) Labs Review Labs Reviewed - No data to display  Imaging Review Dg Tibia/fibula Right  12/07/2015  CLINICAL DATA:  Blunt trauma to lower leg 10 days ago with persistent pain, initial encounter EXAM: RIGHT TIBIA AND FIBULA - 2 VIEW COMPARISON:  None. FINDINGS: No acute abnormality noted. IMPRESSION: Negative. Electronically Signed   By: Alcide Clever M.D.   On: 12/07/2015 13:13   Dg Ankle Complete Right  12/07/2015  CLINICAL DATA:  Pain after hitting pole EXAM: RIGHT ANKLE - COMPLETE 3+ VIEW COMPARISON:  None. FINDINGS: Frontal, oblique, and  lateral views were obtained. There is no demonstrable fracture or joint effusion. The ankle mortise appears intact. There is mild generalized soft tissue swelling. There is a small posterior calcaneal spur. IMPRESSION: Mild generalized soft tissue swelling. No fracture. Mortise intact. Small posterior calcaneal spur. Electronically Signed   By: Bretta BangWilliam  Woodruff III M.D.   On: 12/07/2015 13:14   I have personally reviewed and evaluated these images and lab results as part of my medical decision-making.   EKG Interpretation None      MDM   Final diagnoses:  Ankle sprain, right, initial  encounter    Pt here for left ankle pain that has been ongoing following an injury a week ago.  Pain has been persistent since time of injury and is not worsening.  Pt noted to be tachycardic in ED - he denies CP, SOB, fever or systemic symptoms.  He declines any workup or evaluation other than xrays for his leg following his injury. He states his elevated heart rate is related to drinking heavy at a party last night and a stressful event this morning and he refuses any additional workup.  Discussed importance of further work up/evaluation as well as outpatient follow up for his ankle sprain.      Tilden FossaElizabeth Angelica Frandsen, MD 12/07/15 [phone]

## 2015-12-07 NOTE — ED Notes (Signed)
Patient transported to X-ray 

## 2016-02-22 ENCOUNTER — Emergency Department
Admission: EM | Admit: 2016-02-22 | Discharge: 2016-02-22 | Disposition: A | Discharge status: Home / Self Care | Payer: Self-pay | Attending: Emergency Medicine | Admitting: Emergency Medicine

## 2016-02-22 DIAGNOSIS — Z79899 Other long term (current) drug therapy: Secondary | ICD-10-CM | POA: Insufficient documentation

## 2016-02-22 DIAGNOSIS — I10 Essential (primary) hypertension: Secondary | ICD-10-CM | POA: Insufficient documentation

## 2016-02-22 DIAGNOSIS — Z760 Encounter for issue of repeat prescription: Secondary | ICD-10-CM | POA: Insufficient documentation

## 2016-02-22 DIAGNOSIS — Z87891 Personal history of nicotine dependence: Secondary | ICD-10-CM | POA: Insufficient documentation

## 2016-02-22 MED ORDER — CLONIDINE HCL 0.3 MG PO TABS: 0.3000 mg | ORAL_TABLET | Freq: Three times a day (TID) | ORAL | Status: DC

## 2016-02-22 MED ORDER — AMLODIPINE BESYLATE 10 MG PO TABS: 10.0000 mg | ORAL_TABLET | Freq: Every day | ORAL | Status: DC

## 2016-02-22 MED ORDER — HYDRALAZINE HCL 100 MG PO TABS: 100.0000 mg | ORAL_TABLET | Freq: Two times a day (BID) | ORAL | Status: DC

## 2016-02-22 NOTE — Discharge Instructions (Signed)
Medicine Refill at the Emergency Department We have refilled your medicine today, but it is best for you to get refills through your primary health care provider's office. In the future, please plan ahead so you do not need to get refills from the emergency department. If the medicine we refilled was a maintenance medicine, you may have received only enough to get you by until you are able to see your regular health care provider.   This information is not intended to replace advice given to you by your health care provider. Make sure you discuss any questions you have with your health care provider.   Document Released: 03/27/2004 Document Revised: 12/30/2014 Document Reviewed: 03/18/2014 Elsevier Interactive Patient Education 2016 [employer].   Consider establishing care at the [facility] in [place].

## 2016-02-22 NOTE — ED Provider Notes (Signed)
[facility] Emergency Department Provider Note ____________________________________________  Time seen: 0727  I have reviewed the triage vital signs and the nursing notes.  HISTORY  Chief Complaint  Medication Refill  HPI Cory Rivera is a 45 y.o. male presents to the ED for a medication refill for his blood pressure meds. The patient is without any medical issue or complaint this time. He reports that he has been given a prescription filled month to month and a free clinic offered by a local church in [place].He has missed his most recent visit due to car troubles. He presents today for refills of his Norvasc, Catapres, and Apresoline.  Past Medical History  Diagnosis Date  . Hypertension     Patient Active Problem List   Diagnosis Date Noted  . Cardiomegaly - hypertensive 03/15/2012  . Chronic constipation 03/15/2012  . Lactose intolerance 03/15/2012  . Smoker 03/15/2012  . Hypertension     No past surgical history on file.  Current Outpatient Rx  Name  Route  Sig  Dispense  Refill  . amLODipine (NORVASC) 10 MG tablet   Oral   Take 1 tablet (10 mg total) by mouth daily.   30 tablet   1   . cloNIDine (CATAPRES) 0.3 MG tablet   Oral   Take 1 tablet (0.3 mg total) by mouth 3 (three) times daily.   90 tablet   0   . cloNIDine (CATAPRES) 0.3 MG tablet   Oral   Take 1 tablet (0.3 mg total) by mouth 3 (three) times daily.   90 tablet   1   . hydrALAZINE (APRESOLINE) 100 MG tablet   Oral   Take 1 tablet (100 mg total) by mouth 2 (two) times daily.   60 tablet   0   . hydrALAZINE (APRESOLINE) 100 MG tablet   Oral   Take 1 tablet (100 mg total) by mouth 2 (two) times daily.   60 tablet   1   . hydrochlorothiazide (HYDRODIURIL) 25 MG tablet   Oral   Take 1 tablet (25 mg total) by mouth daily.   30 tablet   0     Allergies Review of patient's allergies indicates no known allergies.  Family History  Problem Relation Age of  Onset  . Cancer Mother     breast  . Cancer Father   . Heart failure Father   . Alcoholism Father     Social History Social History  Substance Use Topics  . Smoking status: Former Games developer  . Smokeless tobacco: Not on file     Comment: smokes marijuana daily, no tobacco  . Alcohol Use: Yes   Review of Systems  Constitutional: Negative for fever. Eyes: Negative for visual changes. ENT: Negative for sore throat. Cardiovascular: Negative for chest pain. Respiratory: Negative for shortness of breath. Gastrointestinal: Negative for abdominal pain, vomiting and diarrhea. Genitourinary: Negative for dysuria. Musculoskeletal: Negative for back pain. Skin: Negative for rash. Neurological: Negative for headaches, focal weakness or numbness. ____________________________________________  PHYSICAL EXAM:  VITAL SIGNS: ED Triage Vitals  Enc Vitals Group     BP 02/22/16 0713 172/94 mmHg     Pulse Rate 02/22/16 0713 103     Resp 02/22/16 0713 18     Temp 02/22/16 0713 98.5 F (36.9 C)     Temp Source 02/22/16 0713 Oral     SpO2 02/22/16 0713 96 %     Weight 02/22/16 0713 200 lb (90.719 kg)     Height 02/22/16 0713   (1.676 m)     Head Cir --      Peak Flow --      Pain Score --      Pain Loc --      Pain Edu? --      Excl. in GC? --    Constitutional: Alert and oriented. Well appearing and in no distress. Head: Normocephalic and atraumatic. Eyes: Conjunctivae are normal. PERRL. Normal extraocular movements Hematological/Lymphatic/Immunological: No cervical lymphadenopathy. Cardiovascular: Normal rate, regular rhythm.  Respiratory: Normal respiratory effort. No wheezes/rales/rhonchi. Gastrointestinal: Soft and nontender. No distention.  Musculoskeletal: Nontender with normal range of motion in all extremities.  Neurologic:  Normal gait without ataxia. Normal speech and language. No gross focal neurologic deficits are appreciated. Skin:  Skin is warm, dry and intact. No  rash noted. Psychiatric: Mood and affect are normal. Patient exhibits appropriate insight and judgment. ____________________________________________  INITIAL IMPRESSION / ASSESSMENT AND PLAN / ED COURSE  Patient with stable essential hypertension with request for med refill today. He is provided with medications as requested. He is referred to the [facility] for ongoing and routine medical management. ____________________________________________  FINAL CLINICAL IMPRESSION(S) / ED DIAGNOSES  Final diagnoses:  Encounter for medication refill      Lissa Hoard, PA-C 02/22/16 [id number]  Sharman Cheek, MD 02/22/16 1545

## 2016-02-22 NOTE — ED Notes (Signed)
Med refill only. Pt a/o with no medical issues.

## 2016-02-22 NOTE — ED Notes (Signed)
Pt states he no longer has insurance and is almost out of his b/p medication and just needs a refill.

## 2016-10-05 ENCOUNTER — Encounter: Payer: Self-pay | Admitting: Emergency Medicine

## 2016-10-05 ENCOUNTER — Emergency Department
Admission: EM | Admit: 2016-10-05 | Discharge: 2016-10-05 | Disposition: A | Discharge status: Home / Self Care | Payer: Self-pay | Attending: Emergency Medicine | Admitting: Emergency Medicine

## 2016-10-05 DIAGNOSIS — Z79899 Other long term (current) drug therapy: Secondary | ICD-10-CM | POA: Insufficient documentation

## 2016-10-05 DIAGNOSIS — Z87891 Personal history of nicotine dependence: Secondary | ICD-10-CM | POA: Insufficient documentation

## 2016-10-05 DIAGNOSIS — I10 Essential (primary) hypertension: Secondary | ICD-10-CM | POA: Insufficient documentation

## 2016-10-05 DIAGNOSIS — Z760 Encounter for issue of repeat prescription: Secondary | ICD-10-CM | POA: Insufficient documentation

## 2016-10-05 MED ORDER — LISINOPRIL-HYDROCHLOROTHIAZIDE 20-25 MG PO TABS: 1.0000 | ORAL_TABLET | Freq: Two times a day (BID) | ORAL | 1 refills | Status: DC

## 2016-10-05 MED ORDER — CLONIDINE HCL 0.3 MG PO TABS: 0.3000 mg | ORAL_TABLET | Freq: Three times a day (TID) | ORAL | 1 refills | Status: DC

## 2016-10-05 MED ORDER — HYDRALAZINE HCL 100 MG PO TABS: 100.0000 mg | ORAL_TABLET | Freq: Two times a day (BID) | ORAL | 1 refills | Status: DC

## 2016-10-05 MED ORDER — AMLODIPINE BESYLATE 10 MG PO TABS: 10.0000 mg | ORAL_TABLET | Freq: Every day | ORAL | 1 refills | Status: DC

## 2016-10-05 NOTE — ED Provider Notes (Signed)
[facility] Emergency Department Provider Note  Time seen: 7:20 AM  I have reviewed the triage vital signs and the nursing notes.   HISTORY  Chief Complaint Medication Refill    HPI Cory Rivera is a 45 y.o. male with a past medical history of hypertension who presents to the emergency department for medication refill. According to the patient he ran out of his medications yesterday. He states he missed this week's appointment with his primary care doctor, and he is trying to get back in, but is out of his blood pressure medications as of last night. Denies any symptoms. Denies any chest pain. States he is just scared to be off of his blood pressure medications.  Past Medical History:  Diagnosis Date  . Hypertension     Patient Active Problem List   Diagnosis Date Noted  . Cardiomegaly - hypertensive 03/15/2012  . Chronic constipation 03/15/2012  . Lactose intolerance 03/15/2012  . Smoker 03/15/2012  . Hypertension     History reviewed. No pertinent surgical history.  Prior to Admission medications   Medication Sig Start Date End Date Taking? Authorizing Provider  lisinopril-hydrochlorothiazide (PRINZIDE,ZESTORETIC) 20-25 MG tablet Take 1 tablet by mouth 2 (two) times daily.   Yes Historical Provider, MD  amLODipine (NORVASC) 10 MG tablet Take 1 tablet (10 mg total) by mouth daily. 02/22/16 02/21/17  Jenise V Bacon Menshew, PA-C  cloNIDine (CATAPRES) 0.3 MG tablet Take 1 tablet (0.3 mg total) by mouth 3 (three) times daily. 10/10/15 10/09/16  Charmayne Sheer Beers, PA-C  cloNIDine (CATAPRES) 0.3 MG tablet Take 1 tablet (0.3 mg total) by mouth 3 (three) times daily. 02/22/16 02/21/17  Jenise V Bacon Menshew, PA-C  hydrALAZINE (APRESOLINE) 100 MG tablet Take 1 tablet (100 mg total) by mouth 2 (two) times daily. 10/10/15 10/09/16  Charmayne Sheer Beers, PA-C  hydrALAZINE (APRESOLINE) 100 MG tablet Take 1 tablet (100 mg total) by mouth 2 (two) times daily. 02/22/16 02/21/17   Jenise V Bacon Menshew, PA-C  hydrochlorothiazide (HYDRODIURIL) 25 MG tablet Take 1 tablet (25 mg total) by mouth daily. 10/10/15   Evangeline Dakin, PA-C    No Known Allergies  Family History  Problem Relation Age of Onset  . Cancer Mother     breast  . Cancer Father   . Heart failure Father   . Alcoholism Father     Social History Social History  Substance Use Topics  . Smoking status: Former Games developer  . Smokeless tobacco: Never Used     Comment: smokes marijuana daily, no tobacco  . Alcohol use Yes     Comment: occ    Review of Systems Constitutional: Negative for fever. Cardiovascular: Negative for chest pain. Respiratory: Negative for shortness of breath. Gastrointestinal: Negative for abdominal pain Neurological: Negative for headache 10-point ROS otherwise negative.  ____________________________________________   PHYSICAL EXAM:  VITAL SIGNS: ED Triage Vitals [10/05/16 0440]  Enc Vitals Group     BP (!) 152/100     Pulse Rate 64     Resp 18     Temp 97.4 F (36.3 C)     Temp Source Oral     SpO2 98 %     Weight 200 lb (90.7 kg)     Height 5\' 6"  (1.676 m)     Head Circumference      Peak Flow      Pain Score      Pain Loc      Pain Edu?  Excl. in GC?     Constitutional: Alert and oriented. Well appearing and in no distress. Eyes: Normal exam ENT   Head: Normocephalic and atraumatic. Cardiovascular: Normal rate, regular rhythm. No murmur Respiratory: Normal respiratory effort without tachypnea nor retractions. Breath sounds are clear Gastrointestinal: Soft and nontender.  Musculoskeletal: Nontender with normal range of motion in all extremities.  Neurologic:  Normal speech and language. No gross focal neurologic deficits  Skin:  Skin is warm, dry and intact.  Psychiatric: Mood and affect are normal.   ____________________________________________    INITIAL IMPRESSION / ASSESSMENT AND PLAN / ED COURSE  Pertinent labs & imaging results  that were available during my care of the patient were reviewed by me and considered in my medical decision making (see chart for details).  The patient presents to the emergency department for high blood pressure, off of his medications. Patient has no complaints at this time. We will refill the patient's blood pressure medications. [facility].  ____________________________________________   FINAL CLINICAL IMPRESSION(S) / ED DIAGNOSES  Hypertension    Minna AntisKevin Lillyian Heidt, MD 10/05/16 [phone]

## 2016-10-05 NOTE — ED Triage Notes (Signed)
Patient states that he ran out of his blood pressure medication yesterday. Patient states that he missed his md appointment. Patient reports that he takes 4 different blood pressure mediations.

## 2016-10-05 NOTE — ED Notes (Signed)
Pt has run out of regular home meds for BP. Pt has been rationing them due to cost, so is not taking them as prescribed. Pt has missed regular PCP appt for this month. Pt denies sxs consistent w/ elevated BP. PT is requesting refill rx's for four meds.

## 2017-01-12 ENCOUNTER — Encounter (HOSPITAL_COMMUNITY): Payer: Self-pay | Admitting: Emergency Medicine

## 2017-01-12 ENCOUNTER — Emergency Department (HOSPITAL_COMMUNITY)
Admission: EM | Admit: 2017-01-12 | Discharge: 2017-01-12 | Disposition: A | Discharge status: Home / Self Care | Payer: Self-pay | Attending: Emergency Medicine | Admitting: Emergency Medicine

## 2017-01-12 DIAGNOSIS — Z760 Encounter for issue of repeat prescription: Secondary | ICD-10-CM | POA: Insufficient documentation

## 2017-01-12 DIAGNOSIS — I10 Essential (primary) hypertension: Secondary | ICD-10-CM | POA: Insufficient documentation

## 2017-01-12 DIAGNOSIS — Z87891 Personal history of nicotine dependence: Secondary | ICD-10-CM | POA: Insufficient documentation

## 2017-01-12 MED ORDER — AMLODIPINE BESYLATE 10 MG PO TABS: 10.0000 mg | ORAL_TABLET | Freq: Every day | ORAL | 0 refills | Status: DC

## 2017-01-12 MED ORDER — CLONIDINE HCL 0.3 MG PO TABS: 0.3000 mg | ORAL_TABLET | Freq: Three times a day (TID) | ORAL | 0 refills | Status: DC

## 2017-01-12 MED ORDER — HYDRALAZINE HCL 100 MG PO TABS: 100.0000 mg | ORAL_TABLET | Freq: Two times a day (BID) | ORAL | 0 refills | Status: DC

## 2017-01-12 MED ORDER — LISINOPRIL-HYDROCHLOROTHIAZIDE 20-25 MG PO TABS: 1.0000 | ORAL_TABLET | Freq: Two times a day (BID) | ORAL | 0 refills | Status: DC

## 2017-01-12 NOTE — ED Provider Notes (Signed)
[facility]PT Provider Note   CSN: [id number] Arrival date & time: 01/12/17  0541     History   Chief Complaint Chief Complaint  Patient presents with  . Needs RX    HPI Cory Rivera is a 46 y.o. male.  Cory Rivera is a 46 y.o. male with a history of hypertension who presents to the emergency department requesting refills for his hypertension medications. Patient reports he missed an appointment with his primary care doctor and then the doctor was out of town. He has been unable to follow-up with the physician to get refills for his hypertension medications. He takes Norvasc, clonidine, hydralazine and lisinopril-hydrochlorothiazide. He reports he ran out of the past 2 days. He denies any headache, chest pain or shortness of breath. He reports he has an appointment for follow-up with his primary care doctor in 2 weeks. Patient denies fevers, headache, numbness, tingling, weakness, chest pain, shortness of breath or other complaints.   The history is provided by the patient and medical records. No language interpreter was used.    Past Medical History:  Diagnosis Date  . Hypertension     Patient Active Problem List   Diagnosis Date Noted  . Cardiomegaly - hypertensive 03/15/2012  . Chronic constipation 03/15/2012  . Lactose intolerance 03/15/2012  . Smoker 03/15/2012  . Hypertension     History reviewed. No pertinent surgical history.     Home Medications    Prior to Admission medications   Medication Sig Start Date End Date Taking? Authorizing Provider  amLODipine (NORVASC) 10 MG tablet Take 1 tablet (10 mg total) by mouth daily. 01/12/17 01/12/18  Everlene FarrierWilliam Everlie Eble, PA-C  cloNIDine (CATAPRES) 0.3 MG tablet Take 1 tablet (0.3 mg total) by mouth 3 (three) times daily. 01/12/17 01/12/18  Everlene FarrierWilliam Krystopher Kuenzel, PA-C  hydrALAZINE (APRESOLINE) 100 MG tablet Take 1 tablet (100 mg total) by mouth 2 (two) times daily. 01/12/17 01/12/18  Everlene FarrierWilliam Darrow Barreiro, PA-C    lisinopril-hydrochlorothiazide (PRINZIDE,ZESTORETIC) 20-25 MG tablet Take 1 tablet by mouth 2 (two) times daily. 01/12/17   Everlene FarrierWilliam Ryun Velez, PA-C    Family History Family History  Problem Relation Age of Onset  . Cancer Mother     breast  . Cancer Father   . Heart failure Father   . Alcoholism Father     Social History Social History  Substance Use Topics  . Smoking status: Former Games developermoker  . Smokeless tobacco: Never Used     Comment: smokes marijuana daily, no tobacco  . Alcohol use Yes     Comment: occ     Allergies   Patient has no known allergies.   Review of Systems Review of Systems  Constitutional: Negative for chills and fever.  Eyes: Negative for visual disturbance.  Respiratory: Negative for shortness of breath.   Cardiovascular: Negative for chest pain.  Genitourinary: Negative for difficulty urinating and dysuria.  Skin: Negative for rash.  Neurological: Negative for weakness, numbness and headaches.     Physical Exam Updated Vital Signs BP 151/100 (BP Location: Right Arm)   Pulse 74   Temp 98.2 F (36.8 C) (Oral)   Resp 14   SpO2 98%   Physical Exam  Constitutional: He appears well-developed and well-nourished. No distress.  HENT:  Head: Normocephalic and atraumatic.  Eyes: Right eye exhibits no discharge. Left eye exhibits no discharge.  Cardiovascular: Normal rate, regular rhythm, normal heart sounds and intact distal pulses.   Pulmonary/Chest: Effort normal. No respiratory distress.  Abdominal: Soft. There is no tenderness.  Neurological: He is alert. Coordination normal.  Skin: No rash noted. He is not diaphoretic.  Psychiatric: He has a normal mood and affect. His behavior is normal.  Nursing note and vitals reviewed.    ED Treatments / Results  Labs (all labs ordered are listed, but only abnormal results are displayed) Labs Reviewed - No data to display  EKG  EKG Interpretation None       Radiology No results  found.  Procedures Procedures (including critical care time)  Medications Ordered in ED Medications - No data to display   Initial Impression / Assessment and Plan / ED Course  I have reviewed the triage vital signs and the nursing notes.  Pertinent labs & imaging results that were available during my care of the patient were reviewed by me and considered in my medical decision making (see chart for details).   This  is a 46 y.o. male with a history of hypertension who presents to the emergency department requesting refills for his hypertension medications. Patient reports he missed an appointment with his primary care doctor and then the doctor was out of town. He has been unable to follow-up with the physician to get refills for his hypertension medications. He takes Norvasc, clonidine, hydralazine and lisinopril-hydrochlorothiazide. He reports he ran out of the past 2 days. He denies any headache, chest pain or shortness of breath. He reports he has an appointment for follow-up with his primary care doctor in 2 weeks.  On exam patient is afebrile nontoxic appearing. His blood pressure is 151/100. He denies any complaints. Will give of 30 days supply of his hypertension medications. He tells me he has follow-up in 2 weeks. I encouraged him to keep this appointment. I stressed the importance of keeping his follow-up appointments. I explained the reasons why this is not a good practice to refill blood pressure medicines in the emergency department. Patient reports he understands. He tells me he is trying to do better. I advised the patient to follow-up with their primary care provider this week. I advised the patient to return to the emergency department with new or worsening symptoms or new concerns. The patient verbalized understanding and agreement with plan.      Final Clinical Impressions(s) / ED Diagnoses   Final diagnoses:  Medication refill  Essential hypertension    New  Prescriptions Current Discharge Medication List       Everlene Farrier, PA-C 01/12/17 1610    Geoffery Lyons, MD 01/12/17 [phone]

## 2017-01-12 NOTE — ED Triage Notes (Signed)
Patient requesting prescriptions for his hypertension , he ran out this week , denies pain or discomfort .

## 2017-01-12 NOTE — ED Notes (Signed)
Declined W/C at D/C and was escorted to lobby by RN. 

## 2017-07-02 ENCOUNTER — Encounter (HOSPITAL_COMMUNITY): Payer: Self-pay | Admitting: Emergency Medicine

## 2017-07-02 ENCOUNTER — Emergency Department (HOSPITAL_COMMUNITY)
Admission: EM | Admit: 2017-07-02 | Discharge: 2017-07-02 | Disposition: A | Discharge status: Home / Self Care | Payer: Self-pay | Attending: Emergency Medicine | Admitting: Emergency Medicine

## 2017-07-02 DIAGNOSIS — F419 Anxiety disorder, unspecified: Secondary | ICD-10-CM | POA: Insufficient documentation

## 2017-07-02 DIAGNOSIS — Z79899 Other long term (current) drug therapy: Secondary | ICD-10-CM | POA: Insufficient documentation

## 2017-07-02 DIAGNOSIS — Z87891 Personal history of nicotine dependence: Secondary | ICD-10-CM | POA: Insufficient documentation

## 2017-07-02 DIAGNOSIS — Z760 Encounter for issue of repeat prescription: Secondary | ICD-10-CM | POA: Insufficient documentation

## 2017-07-02 DIAGNOSIS — I10 Essential (primary) hypertension: Secondary | ICD-10-CM | POA: Insufficient documentation

## 2017-07-02 HISTORY — DX: Anxiety disorder, unspecified: F41.9

## 2017-07-02 NOTE — ED Triage Notes (Signed)
Pt. Stated, I've been having problem with anxiety and Im out of my BP medicine as of tomorrow.

## 2017-07-02 NOTE — ED Provider Notes (Signed)
[facility] Provider Note   CSN: [id number] Arrival date & time: 07/02/17  1317  By signing my name below, I, Cory Rivera, attest that this documentation has been prepared under the direction and in the presence of Langston Masker, [place]. Electronically Signed: [name], Scribe. 07/02/17. 4:24 PM.  History   Chief Complaint Chief Complaint  Patient presents with  . Anxiety  . Medication Refill    BP   The history is provided by the patient. No language interpreter was used.    HPI Comments: Arthor Rivera is a 46 y.o. male who presents to the Emergency Department complaining of constant, gradually worsening anxiety that began the past few days. He has associated light-headedness. Pt usually takes hydralazine 100mg , amlodipine 10mg , and clonidine 0.3mg . He denies other associated injuries.  He is also requesting a refill for his HTN medications. He sees a PCP regularly for this, but has not been able to see him lately for this issue. He usually takes lisinopril hctz 25mg .   Past Medical History:  Diagnosis Date  . Anxiety   . Hypertension     Patient Active Problem List   Diagnosis Date Noted  . Cardiomegaly - hypertensive 03/15/2012  . Chronic constipation 03/15/2012  . Lactose intolerance 03/15/2012  . Smoker 03/15/2012  . Hypertension     History reviewed. No pertinent surgical history.     Home Medications    Prior to Admission medications   Medication Sig Start Date End Date Taking? Authorizing Provider  amLODipine (NORVASC) 10 MG tablet Take 1 tablet (10 mg total) by mouth daily. 01/12/17 01/12/18  Everlene Farrier, PA-C  cloNIDine (CATAPRES) 0.3 MG tablet Take 1 tablet (0.3 mg total) by mouth 3 (three) times daily. 01/12/17 01/12/18  Everlene Farrier, PA-C  hydrALAZINE (APRESOLINE) 100 MG tablet Take 1 tablet (100 mg total) by mouth 2 (two) times daily. 01/12/17 01/12/18  Everlene Farrier, PA-C  lisinopril-hydrochlorothiazide (PRINZIDE,ZESTORETIC) 20-25 MG  tablet Take 1 tablet by mouth 2 (two) times daily. 01/12/17   Everlene Farrier, PA-C    Family History Family History  Problem Relation Age of Onset  . Cancer Mother        breast  . Cancer Father   . Heart failure Father   . Alcoholism Father     Social History Social History  Substance Use Topics  . Smoking status: Former Games developer  . Smokeless tobacco: Never Used     Comment: smokes marijuana daily, no tobacco  . Alcohol use Yes     Comment: occ     Allergies   Patient has no known allergies.   Review of Systems Review of Systems  Neurological: Positive for light-headedness.  Psychiatric/Behavioral: The patient is nervous/anxious.   All other systems reviewed and are negative.    Physical Exam Updated Vital Signs BP (!) 142/80 (BP Location: Left Arm)   Pulse 96   Temp 98.5 F (36.9 C) (Oral)   Resp 16   Ht 5\' 6"  (1.676 m)   Wt 200 lb (90.7 kg)   SpO2 97%   BMI 32.28 kg/m   Physical Exam  Constitutional: He is oriented to person, place, and time. He appears well-developed and well-nourished.  HENT:  Head: Normocephalic.  Eyes: EOM are normal.  Neck: Normal range of motion.  Cardiovascular: Normal rate, regular rhythm and normal heart sounds.  Exam reveals no gallop and no friction rub.   No murmur heard. Pulmonary/Chest: Effort normal and breath sounds normal. No respiratory distress. He has no wheezes.  He has no rales.  Abdominal: He exhibits no distension.  Musculoskeletal: Normal range of motion.  Neurological: He is alert and oriented to person, place, and time.  Psychiatric: He has a normal mood and affect.  Nursing note and vitals reviewed.    ED Treatments / Results  DIAGNOSTIC STUDIES: Oxygen Saturation is 97% on RA, normal by my interpretation.    COORDINATION OF CARE: 4:24 PM Discussed treatment plan with pt at bedside and pt agreed to plan.  Labs (all labs ordered are listed, but only abnormal results are displayed) Labs Reviewed - No  data to display  EKG  EKG Interpretation None       Radiology No results found.  Procedures Procedures (including critical care time)  Medications Ordered in ED Medications - No data to display   Initial Impression / Assessment and Plan / ED Course  I have reviewed the triage vital signs and the nursing notes.  Pertinent labs & imaging results that were available during my care of the patient were reviewed by me and considered in my medical decision making (see chart for details).       Final Clinical Impressions(s) / ED Diagnoses   Final diagnoses:  Medication refill  Hypertension, unspecified type  Anxiety    New Prescriptions New Prescriptions   No medications on file   Pt given rx for his medications and Ativan 1 mg 15 tablets    Cory Rivera, Cory Rivera, [place]C 07/02/17 1641    Geoffery Lyonselo, [place], MD 07/04/17 2359

## 2017-08-29 ENCOUNTER — Emergency Department
Admission: EM | Admit: 2017-08-29 | Discharge: 2017-08-29 | Disposition: A | Discharge status: Home / Self Care | Payer: Self-pay | Attending: Emergency Medicine | Admitting: Emergency Medicine

## 2017-08-29 ENCOUNTER — Encounter: Payer: Self-pay | Admitting: Emergency Medicine

## 2017-08-29 DIAGNOSIS — F419 Anxiety disorder, unspecified: Secondary | ICD-10-CM

## 2017-08-29 DIAGNOSIS — F1290 Cannabis use, unspecified, uncomplicated: Secondary | ICD-10-CM | POA: Insufficient documentation

## 2017-08-29 DIAGNOSIS — Z87891 Personal history of nicotine dependence: Secondary | ICD-10-CM | POA: Insufficient documentation

## 2017-08-29 DIAGNOSIS — Z760 Encounter for issue of repeat prescription: Secondary | ICD-10-CM

## 2017-08-29 DIAGNOSIS — I10 Essential (primary) hypertension: Secondary | ICD-10-CM

## 2017-08-29 MED ORDER — HYDRALAZINE HCL 100 MG PO TABS: 100.0000 mg | ORAL_TABLET | Freq: Two times a day (BID) | ORAL | 2 refills | Status: AC

## 2017-08-29 MED ORDER — CLONIDINE HCL 0.3 MG PO TABS: 0.3000 mg | ORAL_TABLET | Freq: Three times a day (TID) | ORAL | 2 refills | Status: AC

## 2017-08-29 MED ORDER — AMLODIPINE BESYLATE 10 MG PO TABS: 10.0000 mg | ORAL_TABLET | Freq: Every day | ORAL | 2 refills | Status: AC

## 2017-08-29 MED ORDER — LISINOPRIL-HYDROCHLOROTHIAZIDE 20-25 MG PO TABS: 1.0000 | ORAL_TABLET | Freq: Two times a day (BID) | ORAL | 2 refills | Status: AC

## 2017-08-29 NOTE — Discharge Instructions (Signed)
We have refilled your blood pressure medications and provided coupons for [facility] which seems to provide the best prices locally for your medications.  Please try to establish an outpatient primary care doctor; use the phone numbers provided to call and see if there is someone in your area who can be of assistance.  We offered to have used a and speak with a psychiatrist but he declined at this time.  We recommend you follow up with [facility] for your anxiety.    Return to the emergency department if you develop new or worsening symptoms that concern you.

## 2017-08-29 NOTE — ED Triage Notes (Signed)
Pt c/o anxiety for about one mth and is having trouble with his blood pressure. Was seen here for anxiety and was given RX for ativan and pt states is not helping him with his anxiety. Pt states he is under a lot of stress at home. Denies any thoughts of SI or HI. NAD, skin warm and dry.

## 2017-08-29 NOTE — ED Provider Notes (Signed)
[facility] Emergency Department Provider Note  ____________________________________________   First MD Initiated Contact with Patient 08/29/17 0801     (approximate)  I have reviewed the triage vital signs and the nursing notes.   HISTORY  Chief Complaint Anxiety and Hypertension    HPI Cory Rivera is a 46 y.o. male with a history of hypertension and anxiety who presents for evaluation of hypertension and anxiety.  He states that he does not have insurance and does not have a primary care doctor.  He says he cannot afford to go to a clinic for either his anxiety or his primary care and medications.  His church offered some medication services but it has shut down for a while and he thinks that his concern over this issue is making his anxiety worse.  He describes the symptoms he has acute onset of panic attacks with trembling all over and generalized fear.  It usually resolves.  He has been seen in multiple different emergency departments over the last year for medication refills and has been prescribed Ativan in the past although he says it is does not help.  He is almost out of his blood pressure medications which makes him more worried.  He denies SI/HI.  He denies headache, shortness of breath, chest pain, nausea, vomiting, abdominal pain.  He states that when his symptoms happen they are severe right now he feels fine.  He states his blood pressure is usually a little bit high in the morning and then it comes down after he takes his medications.   Past Medical History:  Diagnosis Date  . Anxiety   . Hypertension     Patient Active Problem List   Diagnosis Date Noted  . Cardiomegaly - hypertensive 03/15/2012  . Chronic constipation 03/15/2012  . Lactose intolerance 03/15/2012  . Smoker 03/15/2012  . Hypertension     History reviewed. No pertinent surgical history.  Prior to Admission medications   Medication Sig Start Date End Date Taking?  Authorizing Provider  amLODipine (NORVASC) 10 MG tablet Take 1 tablet (10 mg total) by mouth daily. 08/29/17 08/29/18  Loleta Rose, MD  cloNIDine (CATAPRES) 0.3 MG tablet Take 1 tablet (0.3 mg total) by mouth 3 (three) times daily. 08/29/17 08/29/18  Loleta Rose, MD  hydrALAZINE (APRESOLINE) 100 MG tablet Take 1 tablet (100 mg total) by mouth 2 (two) times daily. 08/29/17 08/29/18  Loleta Rose, MD  lisinopril-hydrochlorothiazide (PRINZIDE,ZESTORETIC) 20-25 MG tablet Take 1 tablet by mouth 2 (two) times daily. 08/29/17   Loleta Rose, MD    Allergies Patient has no known allergies.  Family History  Problem Relation Age of Onset  . Cancer Mother        breast  . Cancer Father   . Heart failure Father   . Alcoholism Father     Social History Social History  Substance Use Topics  . Smoking status: Former Games developer  . Smokeless tobacco: Never Used     Comment: smokes marijuana daily, no tobacco  . Alcohol use Yes     Comment: occ    Review of Systems Constitutional: No fever/chills Eyes: No visual changes. ENT: No sore throat. Cardiovascular: Denies chest pain. Respiratory: Denies shortness of breath. Gastrointestinal: No abdominal pain.  No nausea, no vomiting.  No diarrhea.  No constipation. Genitourinary: Negative for dysuria. Musculoskeletal: Negative for neck pain.  Negative for back pain. Integumentary: Negative for rash. Neurological: Negative for headaches, focal weakness or numbness. Psych:  +Anxiety  ____________________________________________  PHYSICAL EXAM:  VITAL SIGNS: ED Triage Vitals [08/29/17 0745]  Enc Vitals Group     BP (!) 168/88     Pulse Rate 98     Resp 18     Temp 97.8 F (36.6 C)     Temp Source Oral     SpO2 96 %     Weight      Height      Head Circumference      Peak Flow      Pain Score      Pain Loc      Pain Edu?      Excl. in GC?     Constitutional: Alert and oriented. Well appearing and in no acute distress. Eyes: Conjunctivae  are normal.  Head: Atraumatic. Nose: No congestion/rhinnorhea. Mouth/Throat: Mucous membranes are moist. Neck: No stridor.  No meningeal signs.   Cardiovascular: Normal rate, regular rhythm. Good peripheral circulation. Grossly normal heart sounds. Respiratory: Normal respiratory effort.  No retractions. Lungs CTAB. Gastrointestinal: Soft and nontender. No distention.  Musculoskeletal: No lower extremity tenderness nor edema. No gross deformities of extremities. Neurologic:  Normal speech and language. No gross focal neurologic deficits are appreciated.  Skin:  Skin is warm, dry and intact. No rash noted. Psychiatric: Mood and affect are normal. Speech and behavior are normal. Denies SI/HI  ____________________________________________   LABS (all labs ordered are listed, but only abnormal results are displayed)  Labs Reviewed - No data to display ____________________________________________  EKG  None - EKG not ordered by ED physician ____________________________________________  RADIOLOGY   No results found.  ____________________________________________   PROCEDURES  Critical Care performed: No   Procedure(s) performed:   Procedures   ____________________________________________   INITIAL IMPRESSION / ASSESSMENT AND PLAN / ED COURSE  Pertinent labs & imaging results that were available during my care of the patient were reviewed by me and considered in my medical decision making (see chart for details).  the patient is medically stable with acute signs or symptoms.  I explained to him that following up with an outpatient doctor is definitely the best choice but I understand the financial issues.  I will provide information about patient navigate her which may help him find a provider that can be of assistance.  [facility] also [facility]  I explained that I cannot refill his benzodiazepines because the emergency department is  not place for chronic prescriptions of controlled substances but I offered 20 him to stay and speak with Dr. Toni Amendlapacs in a couple hours and he comes in.  The patient does not want to do so.  I have refilled his blood pressure medications and I provided coupons from [employer] for the best possible Price.  I gave my usual and customary return precautions.         ____________________________________________  FINAL CLINICAL IMPRESSION(S) / ED DIAGNOSES  Final diagnoses:  Essential hypertension  Medication refill  Chronic anxiety     MEDICATIONS GIVEN DURING THIS VISIT:  Medications - No data to display   NEW OUTPATIENT MEDICATIONS STARTED DURING THIS VISIT:  Current Discharge Medication List      Current Discharge Medication List    CONTINUE these medications which have CHANGED   Details  amLODipine (NORVASC) 10 MG tablet Take 1 tablet (10 mg total) by mouth daily. Qty: 30 tablet, Refills: 2    cloNIDine (CATAPRES) 0.3 MG tablet Take 1 tablet (0.3 mg total) by mouth 3 (three)  times daily. Qty: 90 tablet, Refills: 2    hydrALAZINE (APRESOLINE) 100 MG tablet Take 1 tablet (100 mg total) by mouth 2 (two) times daily. Qty: 60 tablet, Refills: 2    lisinopril-hydrochlorothiazide (PRINZIDE,ZESTORETIC) 20-25 MG tablet Take 1 tablet by mouth 2 (two) times daily. Qty: 60 tablet, Refills: 2        Current Discharge Medication List       Note:  This document was prepared using Dragon voice recognition software and may include unintentional dictation errors.    Loleta Rose, MD 08/29/17 [phone]
